# Patient Record
Sex: Female | Born: 1970 | Race: White | Hispanic: No | Marital: Single | Smoking: Former smoker
Health system: Southern US, Community
[De-identification: ages and names within clinical notes are randomized; demographics above are authoritative.]

## PROBLEM LIST (undated history)

## (undated) DIAGNOSIS — I2089 Other forms of angina pectoris: Secondary | ICD-10-CM

## (undated) DIAGNOSIS — R42 Dizziness and giddiness: Secondary | ICD-10-CM

## (undated) DIAGNOSIS — R000 Tachycardia, unspecified: Secondary | ICD-10-CM

## (undated) DIAGNOSIS — N951 Menopausal and female climacteric states: Secondary | ICD-10-CM

## (undated) DIAGNOSIS — F319 Bipolar disorder, unspecified: Secondary | ICD-10-CM

## (undated) DIAGNOSIS — E119 Type 2 diabetes mellitus without complications: Secondary | ICD-10-CM

## (undated) DIAGNOSIS — I10 Essential (primary) hypertension: Secondary | ICD-10-CM

## (undated) DIAGNOSIS — E785 Hyperlipidemia, unspecified: Secondary | ICD-10-CM

## (undated) DIAGNOSIS — I208 Other forms of angina pectoris: Secondary | ICD-10-CM

## (undated) DIAGNOSIS — R81 Glycosuria: Secondary | ICD-10-CM

## (undated) DIAGNOSIS — F4310 Post-traumatic stress disorder, unspecified: Secondary | ICD-10-CM

## (undated) DIAGNOSIS — M549 Dorsalgia, unspecified: Secondary | ICD-10-CM

## (undated) DIAGNOSIS — Z87898 Personal history of other specified conditions: Secondary | ICD-10-CM

## (undated) HISTORY — DX: Essential (primary) hypertension: I10

## (undated) HISTORY — DX: Glycosuria: R81

## (undated) HISTORY — PX: CARPAL TUNNEL RELEASE: SHX101

## (undated) HISTORY — DX: Bipolar disorder, unspecified: F31.9

## (undated) HISTORY — DX: Dizziness and giddiness: R42

## (undated) HISTORY — DX: Tachycardia, unspecified: R00.0

## (undated) HISTORY — PX: CHOLECYSTECTOMY: SHX55

## (undated) HISTORY — DX: Hyperlipidemia, unspecified: E78.5

## (undated) HISTORY — DX: Type 2 diabetes mellitus without complications: E11.9

## (undated) HISTORY — PX: ABDOMINAL HYSTERECTOMY: SHX81

## (undated) HISTORY — DX: Menopausal and female climacteric states: N95.1

## (undated) HISTORY — PX: ROTATOR CUFF REPAIR: SHX139

## (undated) HISTORY — DX: Personal history of other specified conditions: Z87.898

## (undated) HISTORY — DX: Other forms of angina pectoris: I20.8

## (undated) HISTORY — DX: Post-traumatic stress disorder, unspecified: F43.10

## (undated) HISTORY — PX: APPENDECTOMY: SHX54

## (undated) HISTORY — DX: Dorsalgia, unspecified: M54.9

---

## 2016-01-07 HISTORY — PX: CHOLECYSTECTOMY: SHX55

## 2019-03-28 ENCOUNTER — Encounter: Payer: Self-pay | Admitting: Internal Medicine

## 2019-04-21 ENCOUNTER — Other Ambulatory Visit: Payer: Self-pay

## 2019-04-21 ENCOUNTER — Telehealth: Payer: Self-pay | Admitting: Internal Medicine

## 2019-04-21 ENCOUNTER — Encounter: Payer: Self-pay | Admitting: Internal Medicine

## 2019-04-21 ENCOUNTER — Ambulatory Visit: Payer: Self-pay

## 2019-04-21 NOTE — Telephone Encounter (Signed)
Noted  

## 2019-04-21 NOTE — Telephone Encounter (Signed)
Patient was a no show and letter sent  °

## 2019-06-29 ENCOUNTER — Encounter: Payer: Self-pay | Admitting: *Deleted

## 2019-08-29 ENCOUNTER — Encounter: Payer: Self-pay | Admitting: Internal Medicine

## 2019-09-07 ENCOUNTER — Ambulatory Visit: Payer: Commercial Managed Care - PPO

## 2019-10-19 ENCOUNTER — Other Ambulatory Visit: Payer: Self-pay

## 2019-10-19 ENCOUNTER — Ambulatory Visit: Payer: Self-pay

## 2019-12-17 ENCOUNTER — Emergency Department (HOSPITAL_COMMUNITY)
Admission: EM | Admit: 2019-12-17 | Discharge: 2019-12-18 | Disposition: A | Discharge status: Home / Self Care | Payer: No Typology Code available for payment source | Attending: Emergency Medicine | Admitting: Emergency Medicine

## 2019-12-17 ENCOUNTER — Encounter (HOSPITAL_COMMUNITY): Payer: Self-pay | Admitting: Emergency Medicine

## 2019-12-17 ENCOUNTER — Other Ambulatory Visit: Payer: Self-pay

## 2019-12-17 DIAGNOSIS — E119 Type 2 diabetes mellitus without complications: Secondary | ICD-10-CM | POA: Insufficient documentation

## 2019-12-17 DIAGNOSIS — S32009A Unspecified fracture of unspecified lumbar vertebra, initial encounter for closed fracture: Secondary | ICD-10-CM | POA: Diagnosis not present

## 2019-12-17 DIAGNOSIS — M542 Cervicalgia: Secondary | ICD-10-CM | POA: Insufficient documentation

## 2019-12-17 DIAGNOSIS — E041 Nontoxic single thyroid nodule: Secondary | ICD-10-CM | POA: Insufficient documentation

## 2019-12-17 DIAGNOSIS — S3991XA Unspecified injury of abdomen, initial encounter: Secondary | ICD-10-CM | POA: Diagnosis not present

## 2019-12-17 DIAGNOSIS — Y92410 Unspecified street and highway as the place of occurrence of the external cause: Secondary | ICD-10-CM | POA: Insufficient documentation

## 2019-12-17 DIAGNOSIS — F17200 Nicotine dependence, unspecified, uncomplicated: Secondary | ICD-10-CM | POA: Diagnosis not present

## 2019-12-17 DIAGNOSIS — S0990XA Unspecified injury of head, initial encounter: Secondary | ICD-10-CM | POA: Diagnosis not present

## 2019-12-17 DIAGNOSIS — S34109A Unspecified injury to unspecified level of lumbar spinal cord, initial encounter: Secondary | ICD-10-CM | POA: Diagnosis present

## 2019-12-17 HISTORY — DX: Type 2 diabetes mellitus without complications: E11.9

## 2019-12-17 HISTORY — DX: Other forms of angina pectoris: I20.89
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
## 2019-12-18 ENCOUNTER — Emergency Department (HOSPITAL_COMMUNITY): Payer: No Typology Code available for payment source

## 2019-12-18 LAB — CBC WITH DIFFERENTIAL/PLATELET
Abs Immature Granulocytes: 0.04 10*3/uL (ref 0.00–0.07)
Basophils Absolute: 0.1 10*3/uL (ref 0.0–0.1)
Basophils Relative: 1 %
Eosinophils Absolute: 0.1 10*3/uL (ref 0.0–0.5)
Eosinophils Relative: 1 %
HCT: 38.1 % (ref 36.0–46.0)
Hemoglobin: 13 g/dL (ref 12.0–15.0)
Immature Granulocytes: 0 %
Lymphocytes Relative: 27 %
Lymphs Abs: 2.6 10*3/uL (ref 0.7–4.0)
MCH: 31.1 pg (ref 26.0–34.0)
MCHC: 34.1 g/dL (ref 30.0–36.0)
MCV: 91.1 fL (ref 80.0–100.0)
Monocytes Absolute: 0.8 10*3/uL (ref 0.1–1.0)
Monocytes Relative: 9 %
Neutro Abs: 5.9 10*3/uL (ref 1.7–7.7)
Neutrophils Relative %: 62 %
Platelets: 308 10*3/uL (ref 150–400)
RBC: 4.18 MIL/uL (ref 3.87–5.11)
RDW: 11.4 % — ABNORMAL LOW (ref 11.5–15.5)
WBC: 9.5 10*3/uL (ref 4.0–10.5)
nRBC: 0 % (ref 0.0–0.2)

## 2019-12-18 LAB — COMPREHENSIVE METABOLIC PANEL
ALT: 24 U/L (ref 0–44)
AST: 21 U/L (ref 15–41)
Albumin: 3.6 g/dL (ref 3.5–5.0)
Alkaline Phosphatase: 72 U/L (ref 38–126)
Anion gap: 12 (ref 5–15)
BUN: 8 mg/dL (ref 6–20)
CO2: 22 mmol/L (ref 22–32)
Calcium: 8.9 mg/dL (ref 8.9–10.3)
Chloride: 101 mmol/L (ref 98–111)
Creatinine, Ser: 0.68 mg/dL (ref 0.44–1.00)
GFR, Estimated: >60 mL/min (ref >60)
Glucose, Bld: 314 mg/dL — ABNORMAL HIGH (ref 70–99)
Potassium: 3.7 mmol/L (ref 3.5–5.1)
Sodium: 135 mmol/L (ref 135–145)
Total Bilirubin: 0.7 mg/dL (ref 0.3–1.2)
Total Protein: 6.8 g/dL (ref 6.5–8.1)

## 2019-12-18 LAB — I-STAT CHEM 8, ED
BUN: 10 mg/dL (ref 6–20)
Calcium, Ion: 1.17 mmol/L (ref 1.15–1.40)
Chloride: 102 mmol/L (ref 98–111)
Creatinine, Ser: 0.5 mg/dL (ref 0.44–1.00)
Glucose, Bld: 311 mg/dL — ABNORMAL HIGH (ref 70–99)
HCT: 39 % (ref 36.0–46.0)
Hemoglobin: 13.3 g/dL (ref 12.0–15.0)
Potassium: 3.7 mmol/L (ref 3.5–5.1)
Sodium: 137 mmol/L (ref 135–145)
TCO2: 25 mmol/L (ref 22–32)

## 2019-12-18 LAB — TROPONIN I (HIGH SENSITIVITY): Troponin I (High Sensitivity): <2 ng/L (ref <18)

## 2019-12-18 MED ORDER — ONDANSETRON HCL 4 MG/2ML IJ SOLN
4.0000 mg | Freq: Once | INTRAMUSCULAR | Status: AC
  Administered 2019-12-18: 09:00:00 4 mg via INTRAVENOUS
  Filled 2019-12-18: qty 2

## 2019-12-18 MED ORDER — DOCUSATE SODIUM 100 MG PO CAPS: 100.0000 mg | ORAL_CAPSULE | Freq: Two times a day (BID) | ORAL | 0 refills | Status: DC

## 2019-12-18 MED ORDER — OXYCODONE-ACETAMINOPHEN 5-325 MG PO TABS
1.0000 | ORAL_TABLET | Freq: Once | ORAL | Status: AC
  Administered 2019-12-18: 12:00:00 1 via ORAL
  Filled 2019-12-18: qty 1

## 2019-12-18 MED ORDER — FENTANYL CITRATE (PF) 100 MCG/2ML IJ SOLN
50.0000 ug | Freq: Once | INTRAMUSCULAR | Status: AC
  Administered 2019-12-18: 09:00:00 50 ug via INTRAVENOUS
  Filled 2019-12-18: qty 2

## 2019-12-18 MED ORDER — HYDROCODONE-ACETAMINOPHEN 5-325 MG PO TABS: 1.0000 | ORAL_TABLET | Freq: Four times a day (QID) | ORAL | 0 refills | Status: DC | PRN

## 2019-12-18 MED ORDER — NAPROXEN 375 MG PO TABS: 375.0000 mg | ORAL_TABLET | Freq: Two times a day (BID) | ORAL | 0 refills | Status: DC

## 2019-12-18 MED ORDER — IOHEXOL 300 MG/ML  SOLN
100.0000 mL | Freq: Once | INTRAMUSCULAR | Status: AC | PRN
  Administered 2019-12-18: 09:00:00 100 mL via INTRAVENOUS

## 2019-12-18 NOTE — ED Notes (Signed)
Ortho tech called for TLSO brace 

## 2019-12-18 NOTE — Discharge Instructions (Addendum)
You were given narcotic and or sedative medications while in the emergency department. Do not drive. Do not use machinery or power tools. Do not sign legal documents. Do not drink alcohol. Do not take sleeping pills. Do not supervise children by yourself. Do not participate in activities that require climbing or being in high places.   Return to the emergency department immediately if you develop any of the following symptoms: You have numbness, tingling, or weakness in the arms or legs. You develop severe headaches not relieved with medicine. You have severe neck pain, especially tenderness in the middle of the back of your neck. You have changes in bowel or bladder control. There is increasing pain in any area of the body. You have shortness of breath, light-headedness, dizziness, or fainting. You have chest pain. You feel sick to your stomach (nauseous), throw up (vomit), or sweat. You have increasing abdominal discomfort. There is blood in your urine, stool, or vomit. You have pain in your shoulder (shoulder strap areas). You feel your symptoms are getting worse.

## 2019-12-18 NOTE — Progress Notes (Signed)
Orthopedic Tech Progress Note Patient Details:  Michele Myers 12/17/1970 [id number]  Ortho Devices Type of Ortho Device: Lumbar corsett (TLSO) Ortho Device/Splint Interventions: Ordered,Application   Post Interventions Patient Tolerated: Well Instructions Provided: Adjustment of device,Care of device,Poper ambulation with device   Acey Woodfield P [name] 12/18/2019, 11:17 AM
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
## 2020-10-08 LAB — LIPID PANEL
Cholesterol: 302 — AB (ref 0–200)
HDL: 46 (ref 35–70)
LDL Cholesterol: 151
Triglycerides: 722 — AB (ref 40–160)

## 2020-10-08 LAB — TSH: TSH: 0.06 — AB (ref 0.41–5.90)

## 2020-10-08 LAB — BASIC METABOLIC PANEL
BUN: 13 (ref 4–21)
Creatinine: 0.8 (ref 0.5–1.1)

## 2020-10-08 LAB — MICROALBUMIN, URINE: Microalb, Ur: <0.2

## 2020-10-08 LAB — HEMOGLOBIN A1C: Hemoglobin A1C: 14

## 2020-11-07 ENCOUNTER — Other Ambulatory Visit (HOSPITAL_COMMUNITY): Payer: Self-pay | Admitting: Sports Medicine

## 2020-11-07 DIAGNOSIS — Z1231 Encounter for screening mammogram for malignant neoplasm of breast: Secondary | ICD-10-CM

## 2020-11-13 ENCOUNTER — Encounter: Payer: Self-pay | Admitting: Internal Medicine

## 2020-11-19 ENCOUNTER — Other Ambulatory Visit: Payer: Self-pay

## 2020-11-19 ENCOUNTER — Ambulatory Visit (HOSPITAL_COMMUNITY)
Admission: RE | Admit: 2020-11-19 | Discharge: 2020-11-19 | Disposition: A | Discharge status: Home / Self Care | Payer: 59 | Source: Ambulatory Visit | Attending: Sports Medicine | Admitting: Sports Medicine

## 2020-11-19 ENCOUNTER — Encounter (HOSPITAL_COMMUNITY): Payer: Self-pay

## 2020-11-19 DIAGNOSIS — Z1231 Encounter for screening mammogram for malignant neoplasm of breast: Secondary | ICD-10-CM | POA: Insufficient documentation

## 2020-11-28 ENCOUNTER — Encounter: Payer: Self-pay | Admitting: "Endocrinology

## 2020-11-28 ENCOUNTER — Ambulatory Visit (INDEPENDENT_AMBULATORY_CARE_PROVIDER_SITE_OTHER): Payer: 59 | Admitting: "Endocrinology

## 2020-11-28 ENCOUNTER — Other Ambulatory Visit: Payer: Self-pay

## 2020-11-28 ENCOUNTER — Other Ambulatory Visit: Payer: Self-pay | Admitting: "Endocrinology

## 2020-11-28 VITALS — BP 116/78 | HR 68 | Ht 59.0 in | Wt 127.0 lb

## 2020-11-28 DIAGNOSIS — E782 Mixed hyperlipidemia: Secondary | ICD-10-CM

## 2020-11-28 DIAGNOSIS — E1165 Type 2 diabetes mellitus with hyperglycemia: Secondary | ICD-10-CM | POA: Insufficient documentation

## 2020-11-28 DIAGNOSIS — I10 Essential (primary) hypertension: Secondary | ICD-10-CM | POA: Insufficient documentation

## 2020-11-28 MED ORDER — ACCU-CHEK GUIDE ME W/DEVICE KIT: 1.0000 | PACK | 0 refills | Status: AC

## 2020-11-28 MED ORDER — ACCU-CHEK GUIDE VI STRP: ORAL_STRIP | 2 refills | Status: DC

## 2020-11-28 NOTE — Patient Instructions (Signed)

## 2020-11-28 NOTE — Progress Notes (Signed)
Endocrinology Consult Note       11/28/2020, 9:06 PM   Subjective:    Patient ID: Michele Myers, female    DOB: [birth year].  Michele Myers is being seen in consultation for management of currently uncontrolled symptomatic diabetes requested by  Leonie Douglas, MD.   Past Medical History:  Diagnosis Date   Angina at rest [facility])    Backache    Bipolar 1 disorder, depressed ([place])    Diabetes mellitus without complication ([place])    Diabetes mellitus, type II ([place])    Dizzy spells    Glucosuria    History of seizures    Hyperlipidemia    Hypertension    Menopausal hot flushes    PTSD (post-traumatic stress disorder)    Tachycardia     Past Surgical History:  Procedure Laterality Date   ABDOMINAL HYSTERECTOMY     APPENDECTOMY     CARPAL TUNNEL RELEASE     CESAREAN SECTION     CHOLECYSTECTOMY     ROTATOR CUFF REPAIR      Social History   Socioeconomic History   Marital status: Single    Spouse name: Not on file   Number of children: Not on file   Years of education: Not on file   Highest education level: Not on file  Occupational History   Not on file  Tobacco Use   Smoking status: Former    Types: Cigarettes    Quit date: 09/10/2020    Years since quitting: 0.2   Smokeless tobacco: Never  Vaping Use   Vaping Use: Every day  Substance and Sexual Activity   Alcohol use: Never   Drug use: Never   Sexual activity: Not on file  Other Topics Concern   Not on file  Social History Narrative   Not on file   Social Determinants of Health   Financial Resource Strain: Not on file  Food Insecurity: Not on file  Transportation Needs: Not on file  Physical Activity: Not on file  Stress: Not on file  Social Connections: Not on file    Family History  Problem Relation Age of Onset   Cancer Mother    Kidney disease Mother    Thyroid disease Mother    Diabetes Mother     Hyperlipidemia Mother    Osteoporosis Mother    Cancer Father    Hyperlipidemia Father    Hypertension Father    Heart attack Father    Breast cancer Sister    Breast cancer Maternal Aunt     Outpatient Encounter Medications as of 11/28/2020  Medication Sig   Blood Glucose Monitoring Suppl (ACCU-CHEK GUIDE ME) w/Device KIT 1 Piece by Does not apply route as directed.   nitroGLYCERIN (NITROSTAT) 0.4 MG SL tablet Place 0.4 mg under the tongue every 5 (five) minutes as needed for chest pain.   [DISCONTINUED] glucose blood (ACCU-CHEK GUIDE) test strip Use as instructed   atorvastatin (LIPITOR) 20 MG tablet Take 20 mg by mouth daily.   buPROPion (WELLBUTRIN SR) 150 MG 12 hr tablet Take 150 mg by mouth every morning.   FLUoxetine (PROZAC) 20 MG  capsule Take 20 mg by mouth daily.   lisinopril (ZESTRIL) 5 MG tablet Take 5 mg by mouth daily.   metFORMIN (GLUCOPHAGE) 500 MG tablet Take 500 mg by mouth 2 (two) times daily after a meal.   [DISCONTINUED] docusate sodium (COLACE) 100 MG capsule Take 1 capsule (100 mg total) by mouth every 12 (twelve) hours.   [DISCONTINUED] HYDROcodone-acetaminophen (NORCO) 5-325 MG tablet Take 1 tablet by mouth every 6 (six) hours as needed for severe pain.   [DISCONTINUED] naproxen (NAPROSYN) 375 MG tablet Take 1 tablet (375 mg total) by mouth 2 (two) times daily with a meal.   No facility-administered encounter medications on file as of 11/28/2020.    ALLERGIES: Allergies  Allergen Reactions   Codeine Anxiety    Rapid heart beat    VACCINATION STATUS:  There is no immunization history on file for this patient.  Diabetes She has type 2 diabetes mellitus. Onset time: she was diagnosed at approximate age of 50 yrs. There are no hypoglycemic associated symptoms. Pertinent negatives for hypoglycemia include no headaches, seizures or tremors. Associated symptoms include polydipsia and polyuria. Pertinent negatives for diabetes include no chest pain. There are  no hypoglycemic complications. Symptoms are worsening. There are no diabetic complications. Risk factors for coronary artery disease include dyslipidemia, diabetes mellitus, family history, sedentary lifestyle and tobacco exposure. Current diabetic treatment includes oral agent (monotherapy). Her weight is fluctuating minimally. She is following a generally unhealthy diet. When asked about meal planning, she reported none. She has not had a previous visit with a dietitian. She never participates in exercise. Her home blood glucose trend is fluctuating minimally. (She did not bring any meter nor logs to review. Her a1c was found to be  14%.  She does not have a functioning meter.) An ACE inhibitor/angiotensin II receptor blocker is being taken. Eye exam is current.  Hyperlipidemia This is a chronic problem. The current episode started more than 1 year ago. The problem is uncontrolled. Pertinent negatives include no chest pain, myalgias or shortness of breath. Current antihyperlipidemic treatment includes statins. Risk factors for coronary artery disease include diabetes mellitus, dyslipidemia, family history, hypertension and a sedentary lifestyle.  Hypertension This is a chronic problem. The current episode started more than 1 year ago. Pertinent negatives include no chest pain, headaches, palpitations or shortness of breath. Risk factors for coronary artery disease include diabetes mellitus, dyslipidemia, sedentary lifestyle and smoking/tobacco exposure. Past treatments include ACE inhibitors.    Review of Systems  Constitutional:  Negative for chills and fever.  Respiratory:  Negative for cough and shortness of breath.   Cardiovascular:  Negative for chest pain and palpitations.       No Shortness of breath  Gastrointestinal:  Negative for abdominal pain, diarrhea, nausea and vomiting.  Endocrine: Positive for polydipsia and polyuria.  Genitourinary:  Negative for frequency, hematuria and urgency.   Musculoskeletal:  Negative for myalgias.  Skin:  Negative for rash.  Neurological:  Negative for tremors, seizures and headaches.  Hematological:  Does not bruise/bleed easily.  Psychiatric/Behavioral:  Negative for hallucinations and suicidal ideas.    Objective:    Vitals with BMI 11/28/2020 12/18/2019 12/18/2019  Height _0  - -  Weight 127 lbs - -  BMI 83.41 - -  Systolic 962 229 798  Diastolic 78 71 62  Pulse 68 68 63    BP 116/78   Pulse 68   Ht _1  (1.499 m)   Wt 127 lb (57.6 kg)   BMI  25.65 kg/m   Wt Readings from Last 3 Encounters:  11/28/20 127 lb (57.6 kg)  12/17/19 143 lb (64.9 kg)     Physical Exam Constitutional:      Appearance: She is well-developed.  HENT:     Head: Normocephalic and atraumatic.  Neck:     Thyroid: No thyromegaly.     Trachea: No tracheal deviation.  Cardiovascular:     Rate and Rhythm: Normal rate and regular rhythm.  Pulmonary:     Effort: Pulmonary effort is normal.     Breath sounds: Normal breath sounds.  Abdominal:     Tenderness: There is no abdominal tenderness. There is no guarding.  Musculoskeletal:        General: Normal range of motion.     Cervical back: Normal range of motion and neck supple.  Skin:    General: Skin is warm and dry.     Coloration: Skin is not pale.     Findings: No erythema or rash.  Neurological:     Mental Status: She is alert and oriented to person, place, and time.     Cranial Nerves: No cranial nerve deficit.     Coordination: Coordination normal.     Deep Tendon Reflexes: Reflexes are normal and symmetric.  Psychiatric:        Judgment: Judgment normal.      CMP ( most recent) CMP     Component Value Date/Time   NA 137 12/18/2019 0858   K 3.7 12/18/2019 0858   CL 102 12/18/2019 0858   CO2 22 12/18/2019 0849   GLUCOSE 311 (H) 12/18/2019 0858   BUN 13 10/08/2020 0000   CREATININE 0.8 10/08/2020 0000   CREATININE 0.50 12/18/2019 0858   CALCIUM 8.9 12/18/2019 0849    PROT 6.8 12/18/2019 0849   ALBUMIN 3.6 12/18/2019 0849   AST 21 12/18/2019 0849   ALT 24 12/18/2019 0849   ALKPHOS 72 12/18/2019 0849   BILITOT 0.7 12/18/2019 0849   GFRNONAA >60 12/18/2019 0849     Diabetic Labs (most recent): Lab Results  Component Value Date   HGBA1C 14 10/08/2020     Lipid Panel ( most recent) Lipid Panel     Component Value Date/Time   CHOL 302 (A) 10/08/2020 0000   TRIG 722 (A) 10/08/2020 0000   HDL 46 10/08/2020 0000   LDLCALC 151 10/08/2020 0000      Lab Results  Component Value Date   TSH 0.06 (A) 10/08/2020       Assessment & Plan:   1. Type 2 diabetes mellitus with hyperglycemia, without long-term current use of insulin ([place])   - Aleshia Cartelli Goyal has currently uncontrolled symptomatic type 2 DM since  50 years of age,  with most recent A1c of 14%. Recent labs reviewed. - I had a long discussion with her about the progressive nature of diabetes and the pathology behind its complications. 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 These are all discussed in detail with her.  - I have counseled her on diet  and weight management  by adopting a carbohydrate restricted/protein rich diet. Patient is encouraged to switch to  unprocessed or minimally processed     complex starch and increased protein intake (animal or plant source), fruits, and vegetables. -  she is advised to stick to a routine mealtimes to  eat 3 meals  a day and avoid unnecessary snacks ( to snack only to correct hypoglycemia).   - she acknowledges that there is a room for improvement in her food and drink choices. - Suggestion is made for her to avoid simple carbohydrates  from her diet including Cakes, Sweet Desserts, Ice Cream, Soda (diet and regular), Sweet Tea, Candies, Chips, Cookies, Store Bought Juices,  Alcohol in Excess of  1-2 drinks a day, Artificial Sweeteners,  Coffee Creamer, and "Sugar-free" Products. This will help patient to have more stable blood glucose profile and potentially avoid unintended weight gain.  - she will be scheduled with Jearld Fenton, RDN, CDE for diabetes education.  - I have approached her with the following individualized plan to manage  her diabetes and patient agrees:   - she weighed 100 + lbs more than her current weight in th epast, lost weigh mostly unintentionally. 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 Priority will be to avoid hypoglycemia in this patient. She will likely need at least basal insulin in order for her to control glycemia. In prep, she is approached to initiate strict monitoring of glucose 4 times a day  -before meals and at bedtime, and return for  avisit in  days with her meter and logs.  -currently, she is not symptomatic of hyperglycemia.  - she is encouraged to call clinic for blood glucose levels less than 70 or above 300 mg /dl. - she is advised to increase metformin to 522m po BID, she hesitantly accepts.   - she is not a candidate for SGLT2 inhibitors and incretin therapy .  - Specific targets for  A1c;  LDL, HDL,  and Triglycerides were discussed with the patient.  2) Blood Pressure /Hypertension:  her blood pressure is controlled to target.   she is advised to continue her current medications including lisinopril 5 mg p.o. daily with breakfast . 3) Lipids/Hyperlipidemia:   Review of her recent lipid panel showed uncontrolled  LDL at 151.  she  is advised to continue   atorvastatin 20 mg daily at bedtime.  Side effects and precautions discussed with her.  4)  Weight/Diet:  Body mass index is 25.65 kg/m.  -    she is not a candidate for weight loss. I discussed with her the fact that loss of 5 - 10% of her  current body weight will have the most impact on her diabetes management.   Plant  [employer] , [employer]- Lifestyle Nutrition is discussed and recommended to the patient. Optimal Exercise, Restorative Sleep  information was detailed on discharge instructions. If she continues to lose weight unintentionally, she will be considered for creon therapy.  5) Chronic Care/Health Maintenance:  -she  is on ACEI/ARB and Statin medications and  is encouraged to initiate and continue to follow up with Ophthalmology, Dentist,  Podiatrist at least yearly or according to recommendations, and advised to  quit smoking. I have recommended yearly flu vaccine and pneumonia vaccine at least every 5 years; moderate intensity exercise for up to 150 minutes weekly; and  sleep for 7- 9 hours a day.  The patient was counseled on the dangers of tobacco use, and was advised to quit.  Reviewed strategies to maximize success, including removing cigarettes and smoking materials from environment.   - she is  advised to maintain close follow up with BLeonie Douglas MD for primary care needs, as well as her other providers for optimal and coordinated  care.   I spent 65 minutes in the care of the patient today including review of labs from [place], Lipids, Thyroid Function, Hematology (current and previous including abstractions from other facilities); face-to-face time discussing  her blood glucose readings/logs, discussing hypoglycemia and hyperglycemia episodes and symptoms, medications doses, her options of short and long term treatment based on the latest standards of care / guidelines;  discussion about incorporating lifestyle medicine;  and documenting the encounter.     Please refer to Patient Instructions for Blood Glucose Monitoring and Insulin/Medications Dosing Guide"  in media tab for additional information. Please  also refer to " Patient Self Inventory" in the Media  tab for reviewed elements of pertinent patient history.  Michele Manges Reposa participated in the discussions, expressed understanding,  and voiced agreement with the above plans.  All questions were answered to her satisfaction. she is encouraged to contact clinic should she have any questions or concerns prior to her return visit.   Follow up plan: - Return in about 1 week (around 12/05/2020).  Glade Lloyd, MD [facility] [address] [place], [place] [address] Phone: [phone]  Fax: [phone]    11/28/2020, 9:06 PM  This note was partially dictated with voice recognition software. Similar sounding words can be transcribed inadequately or may not  be corrected upon review.

## 2020-11-28 NOTE — Telephone Encounter (Signed)
Patient had first visit today with you.  How many times does patient need to check BG daily? Insurance needs for pharmacy to fill for patient.

## 2020-12-10 ENCOUNTER — Ambulatory Visit (INDEPENDENT_AMBULATORY_CARE_PROVIDER_SITE_OTHER): Payer: BC Managed Care – PPO | Admitting: "Endocrinology

## 2020-12-10 ENCOUNTER — Encounter: Payer: Self-pay | Admitting: "Endocrinology

## 2020-12-10 ENCOUNTER — Other Ambulatory Visit: Payer: Self-pay

## 2020-12-10 VITALS — BP 108/76 | HR 72 | Ht 59.0 in | Wt 132.4 lb

## 2020-12-10 DIAGNOSIS — I10 Essential (primary) hypertension: Secondary | ICD-10-CM | POA: Diagnosis not present

## 2020-12-10 DIAGNOSIS — E782 Mixed hyperlipidemia: Secondary | ICD-10-CM | POA: Diagnosis not present

## 2020-12-10 DIAGNOSIS — E1165 Type 2 diabetes mellitus with hyperglycemia: Secondary | ICD-10-CM | POA: Diagnosis not present

## 2020-12-10 MED ORDER — METFORMIN HCL ER 500 MG PO TB24: 500.0000 mg | ORAL_TABLET | Freq: Every day | ORAL | 3 refills | Status: DC

## 2020-12-10 MED ORDER — GLIPIZIDE ER 2.5 MG PO TB24: 5.0000 mg | ORAL_TABLET | Freq: Every day | ORAL | 1 refills | Status: DC

## 2020-12-10 NOTE — Progress Notes (Signed)
12/10/2020, 5:56 PM  Endocrinology follow-up note   Subjective:    Patient ID: Michele Myers, female    DOB: 10/15/1970.  Michele Myers is being seen in follow-up after she was seen in consultation for management of currently uncontrolled symptomatic diabetes requested by  Leonie Douglas, MD.   Past Medical History:  Diagnosis Date   Angina at rest [facility])    Backache    Bipolar 1 disorder, depressed ([place])    Diabetes mellitus without complication ([place])    Diabetes mellitus, type II ([place])    Dizzy spells    Glucosuria    History of seizures    Hyperlipidemia    Hypertension    Menopausal hot flushes    PTSD (post-traumatic stress disorder)    Tachycardia     Past Surgical History:  Procedure Laterality Date   ABDOMINAL HYSTERECTOMY     APPENDECTOMY     CARPAL TUNNEL RELEASE     CESAREAN SECTION     CHOLECYSTECTOMY     ROTATOR CUFF REPAIR      Social History   Socioeconomic History   Marital status: Single    Spouse name: Not on file   Number of children: Not on file   Years of education: Not on file   Highest education level: Not on file  Occupational History   Not on file  Tobacco Use   Smoking status: Former    Types: Cigarettes    Quit date: 09/10/2020    Years since quitting: 0.2   Smokeless tobacco: Never  Vaping Use   Vaping Use: Every day  Substance and Sexual Activity   Alcohol use: Never   Drug use: Never   Sexual activity: Not on file  Other Topics Concern   Not on file  Social History Narrative   Not on file   Social Determinants of Health   Financial Resource Strain: Not on file  Food Insecurity: Not on file  Transportation Needs: Not on file  Physical Activity: Not on file  Stress: Not on file  Social Connections: Not on file    Family History  Problem Relation Age of Onset   Cancer Mother    Kidney disease Mother    Thyroid disease Mother     Diabetes Mother    Hyperlipidemia Mother    Osteoporosis Mother    Cancer Father    Hyperlipidemia Father    Hypertension Father    Heart attack Father    Breast cancer Sister    Breast cancer Maternal Aunt     Outpatient Encounter Medications as of 12/10/2020  Medication Sig   glipiZIDE (GLUCOTROL XL) 2.5 MG 24 hr tablet Take 2 tablets (5 mg total) by mouth daily with breakfast.   metFORMIN (GLUCOPHAGE XR) 500 MG 24 hr tablet Take 1 tablet (500 mg total) by mouth daily with breakfast.   atorvastatin (LIPITOR) 20 MG tablet Take 20 mg by mouth daily.   Blood Glucose Monitoring Suppl (ACCU-CHEK GUIDE ME) w/Device KIT 1 Piece by Does not apply route as directed.   buPROPion (WELLBUTRIN SR) 150 MG 12 hr tablet Take 150 mg by mouth every morning.  FLUoxetine (PROZAC) 20 MG capsule Take 20 mg by mouth daily.   glucose blood (ACCU-CHEK GUIDE) test strip USE TO CHECK BOOD GLUCOSE  4 TIMES DAILY   lisinopril (ZESTRIL) 5 MG tablet Take 5 mg by mouth daily.   nitroGLYCERIN (NITROSTAT) 0.4 MG SL tablet Place 0.4 mg under the tongue every 5 (five) minutes as needed for chest pain.   [DISCONTINUED] metFORMIN (GLUCOPHAGE) 500 MG tablet Take 500 mg by mouth 2 (two) times daily after a meal.   No facility-administered encounter medications on file as of 12/10/2020.    ALLERGIES: Allergies  Allergen Reactions   Codeine Anxiety    Rapid heart beat    VACCINATION STATUS:  There is no immunization history on file for this patient.  Diabetes She presents for her follow-up diabetic visit. She has type 2 diabetes mellitus. Onset time: she was diagnosed at approximate age of 9 yrs. There are no hypoglycemic associated symptoms. Pertinent negatives for hypoglycemia include no headaches, seizures or tremors. Associated symptoms include blurred vision. Pertinent negatives for diabetes include no chest pain, no polydipsia and no polyuria. There are no hypoglycemic complications. Symptoms are worsening.  There are no diabetic complications. Risk factors for coronary artery disease include dyslipidemia, diabetes mellitus, family history, sedentary lifestyle and tobacco exposure. Current diabetic treatment includes oral agent (monotherapy). Her weight is fluctuating minimally. She is following a generally unhealthy diet. When asked about meal planning, she reported none. She has not had a previous visit with a dietitian. She never participates in exercise. Her home blood glucose trend is decreasing steadily. Her breakfast blood glucose range is generally >200 mg/dl. Her lunch blood glucose range is generally 140-180 mg/dl. Her dinner blood glucose range is generally 140-180 mg/dl. Her bedtime blood glucose range is generally 140-180 mg/dl. Her overall blood glucose range is 180-200 mg/dl. (She made significant change in her diet.  Presents with much better blood glucose profile.  This is despite her recent A1c of 14%.  She still has uncontrolled fasting glycemic profile.   ) An ACE inhibitor/angiotensin II receptor blocker is being taken. Eye exam is current.  Hyperlipidemia This is a chronic problem. The current episode started more than 1 year ago. The problem is uncontrolled. Pertinent negatives include no chest pain, myalgias or shortness of breath. Current antihyperlipidemic treatment includes statins. Risk factors for coronary artery disease include diabetes mellitus, dyslipidemia, family history, hypertension and a sedentary lifestyle.  Hypertension This is a chronic problem. The current episode started more than 1 year ago. Associated symptoms include blurred vision. Pertinent negatives include no chest pain, headaches, palpitations or shortness of breath. Risk factors for coronary artery disease include diabetes mellitus, dyslipidemia, sedentary lifestyle and smoking/tobacco exposure. Past treatments include ACE inhibitors.    Review of Systems  Constitutional:  Negative for chills and fever.  Eyes:   Positive for blurred vision.  Respiratory:  Negative for cough and shortness of breath.   Cardiovascular:  Negative for chest pain and palpitations.       No Shortness of breath  Gastrointestinal:  Negative for abdominal pain, diarrhea, nausea and vomiting.  Endocrine: Negative for polydipsia and polyuria.  Genitourinary:  Negative for frequency, hematuria and urgency.  Musculoskeletal:  Negative for myalgias.  Skin:  Negative for rash.  Neurological:  Negative for tremors, seizures and headaches.  Hematological:  Does not bruise/bleed easily.  Psychiatric/Behavioral:  Negative for hallucinations and suicidal ideas.    Objective:    Vitals with BMI 12/10/2020 11/28/2020 12/18/2019  Height 4'  11" 4' 11"  -  Weight 132 lbs 6 oz 127 lbs -  BMI 58.09 98.33 -  Systolic 825 053 976  Diastolic 76 78 71  Pulse 72 68 68    BP 108/76   Pulse 72   Ht 4' 11"  (1.499 m)   Wt 132 lb 6.4 oz (60.1 kg)   BMI 26.74 kg/m   Wt Readings from Last 3 Encounters:  12/10/20 132 lb 6.4 oz (60.1 kg)  11/28/20 127 lb (57.6 kg)  12/17/19 143 lb (64.9 kg)     Physical Exam Constitutional:      Appearance: She is well-developed.  HENT:     Head: Normocephalic and atraumatic.  Neck:     Thyroid: No thyromegaly.     Trachea: No tracheal deviation.  Cardiovascular:     Rate and Rhythm: Normal rate and regular rhythm.  Pulmonary:     Effort: Pulmonary effort is normal.     Breath sounds: Normal breath sounds.  Abdominal:     Tenderness: There is no abdominal tenderness. There is no guarding.  Musculoskeletal:        General: Normal range of motion.     Cervical back: Normal range of motion and neck supple.  Skin:    General: Skin is warm and dry.     Coloration: Skin is not pale.     Findings: No erythema or rash.  Neurological:     Mental Status: She is alert and oriented to person, place, and time.     Cranial Nerves: No cranial nerve deficit.     Coordination: Coordination normal.      Deep Tendon Reflexes: Reflexes are normal and symmetric.  Psychiatric:        Judgment: Judgment normal.      CMP ( most recent) CMP     Component Value Date/Time   NA 137 12/18/2019 0858   K 3.7 12/18/2019 0858   CL 102 12/18/2019 0858   CO2 22 12/18/2019 0849   GLUCOSE 311 (H) 12/18/2019 0858   BUN 13 10/08/2020 0000   CREATININE 0.8 10/08/2020 0000   CREATININE 0.50 12/18/2019 0858   CALCIUM 8.9 12/18/2019 0849   PROT 6.8 12/18/2019 0849   ALBUMIN 3.6 12/18/2019 0849   AST 21 12/18/2019 0849   ALT 24 12/18/2019 0849   ALKPHOS 72 12/18/2019 0849   BILITOT 0.7 12/18/2019 0849   GFRNONAA >60 12/18/2019 0849     Diabetic Labs (most recent): Lab Results  Component Value Date   HGBA1C 14 10/08/2020     Lipid Panel ( most recent) Lipid Panel     Component Value Date/Time   CHOL 302 (A) 10/08/2020 0000   TRIG 722 (A) 10/08/2020 0000   HDL 46 10/08/2020 0000   LDLCALC 151 10/08/2020 0000      Lab Results  Component Value Date   TSH 0.06 (A) 10/08/2020       Assessment & Plan:   1. Type 2 diabetes mellitus with hyperglycemia, without long-term current use of insulin ([place])   - Michele Myers has currently uncontrolled symptomatic type 2 DM since  50 years of age. She made significant change in her diet.  Presents with much better blood glucose profile.  This is despite her recent A1c of 14%.  She still has uncontrolled fasting glycemic profile.     Recent labs reviewed. - I had a long discussion with her about the progressive nature of diabetes and the pathology behind its complications. 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 These are all discussed in detail with her.  - I have counseled her on diet  and weight management  by adopting a carbohydrate restricted/protein rich diet.  Patient is encouraged to switch to  unprocessed or minimally processed     complex starch and increased protein intake (animal or plant source), fruits, and vegetables. -  she is advised to stick to a routine mealtimes to eat 3 meals  a day and avoid unnecessary snacks ( to snack only to correct hypoglycemia).   - she acknowledges that there is a room for improvement in her food and drink choices. - Suggestion is made for her to avoid simple carbohydrates  from her diet including Cakes, Sweet Desserts, Ice Cream, Soda (diet and regular), Sweet Tea, Candies, Chips, Cookies, Store Bought Juices, Alcohol in Excess of  1-2 drinks a day, Artificial Sweeteners,  Coffee Creamer, and "Sugar-free" Products, Lemonade. This will help patient to have more stable blood glucose profile and potentially avoid unintended weight gain.  - she will be scheduled with Jearld Fenton, RDN, CDE for diabetes education.  - I have approached her with the following individualized plan to manage  her diabetes and patient agrees:   - she weighed 100 + lbs more than her current weight in th epast, lost weigh mostly unintentionally. 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 Priority will be to avoid hypoglycemia in this patient.  She is determined to stay engaged in her lifestyle change.  She will not need insulin treatment for now.  However, to give her better control of fasting glycemic profile, I advised her to start glipizide 2.5 mg XL p.o. daily at breakfast.   -She did not tolerate regular strength metformin.  I discussed and prescribed metformin 500 mg XR p.o. daily at breakfast.  -She is willing and advised to continue monitoring blood glucose twice a day-daily before breakfast and at bedtime.   - she is encouraged to call clinic for blood glucose levels less than 70 or above 200 mg /dl.   - she is not a candidate for SGLT2 inhibitors and incretin therapy .  - Specific targets for  A1c;   LDL, HDL,  and Triglycerides were discussed with the patient.  2) Blood Pressure /Hypertension:  her blood pressure is controlled to target.   she is advised to continue her current medications including lisinopril 5 mg p.o. daily with breakfast . 3) Lipids/Hyperlipidemia:   Review of her recent lipid panel showed uncontrolled  LDL at 151.  she  is advised to continue   atorvastatin 20 mg p.o. daily at bedtime.   Side effects and precautions discussed with her.  4)  Weight/Diet:  Body mass index is 26.74 kg/m.  -    she is not a candidate for weight loss. I discussed with her the fact that loss of 5 - 10% of her  current body weight will have the most impact on her diabetes management.   [employer] , [employer]- Lifestyle Nutrition is discussed and recommended to the patient. Optimal Exercise, Restorative Sleep  information was detailed on discharge instructions. If she continues to lose weight unintentionally, she will be considered for creon therapy.  5) Chronic Care/Health Maintenance:  -she  is on ACEI/ARB and Statin medications and  is encouraged to  initiate and continue to follow up with Ophthalmology, Dentist,  Podiatrist at least yearly or according to recommendations, and advised to  quit smoking. I have recommended yearly flu vaccine and pneumonia vaccine at least every 5 years; moderate intensity exercise for up to 150 minutes weekly; and  sleep for 7- 9 hours a day.  The patient was counseled on the dangers of tobacco use, and was advised to quit.  Reviewed strategies to maximize success, including removing cigarettes and smoking materials from environment.   - she is  advised to maintain close follow up with Leonie Douglas, MD for primary care needs, as well as her other providers for optimal and coordinated care.   I spent 44 minutes in the care of the patient today including review of labs from [place], Lipids, Thyroid Function, Hematology (current and previous including  abstractions from other facilities); face-to-face time discussing  her blood glucose readings/logs, discussing hypoglycemia and hyperglycemia episodes and symptoms, medications doses, her options of short and long term treatment based on the latest standards of care / guidelines;  discussion about incorporating lifestyle medicine;  and documenting the encounter.    Please refer to Patient Instructions for Blood Glucose Monitoring and Insulin/Medications Dosing Guide"  in media tab for additional information. Please  also refer to " Patient Self Inventory" in the Media  tab for reviewed elements of pertinent patient history.  Michele Myers participated in the discussions, expressed understanding, and voiced agreement with the above plans.  All questions were answered to her satisfaction. she is encouraged to contact clinic should she have any questions or concerns prior to her return visit.    Follow up plan: - Return in about 3 months (around 03/10/2021) for Bring Meter and Logs- A1c in Office.  Glade Lloyd, MD [facility] [address] [place], [place] [address] Phone: [phone]  Fax: [phone]    12/10/2020, 5:56 PM  This note was partially dictated with voice recognition software. Similar sounding words can be transcribed inadequately or may not  be corrected upon review.

## 2020-12-10 NOTE — Patient Instructions (Signed)

## 2021-01-02 ENCOUNTER — Ambulatory Visit: Payer: Self-pay

## 2021-01-06 ENCOUNTER — Other Ambulatory Visit: Payer: Self-pay | Admitting: "Endocrinology

## 2021-01-16 ENCOUNTER — Ambulatory Visit: Payer: 59 | Admitting: Nutrition

## 2021-03-12 ENCOUNTER — Other Ambulatory Visit: Payer: Self-pay

## 2021-03-12 ENCOUNTER — Ambulatory Visit (INDEPENDENT_AMBULATORY_CARE_PROVIDER_SITE_OTHER): Payer: BC Managed Care – PPO | Admitting: "Endocrinology

## 2021-03-12 ENCOUNTER — Encounter: Payer: Self-pay | Admitting: "Endocrinology

## 2021-03-12 VITALS — BP 120/82 | HR 80 | Ht 59.0 in | Wt 144.6 lb

## 2021-03-12 DIAGNOSIS — E1165 Type 2 diabetes mellitus with hyperglycemia: Secondary | ICD-10-CM

## 2021-03-12 DIAGNOSIS — E782 Mixed hyperlipidemia: Secondary | ICD-10-CM | POA: Diagnosis not present

## 2021-03-12 DIAGNOSIS — I10 Essential (primary) hypertension: Secondary | ICD-10-CM

## 2021-03-12 LAB — POCT GLYCOSYLATED HEMOGLOBIN (HGB A1C): HbA1c, POC (controlled diabetic range): 8.5 % — AB (ref 0.0–7.0)

## 2021-03-12 MED ORDER — SITAGLIPTIN PHOSPHATE 50 MG PO TABS: 50.0000 mg | ORAL_TABLET | Freq: Every day | ORAL | 1 refills | Status: DC

## 2021-03-12 NOTE — Progress Notes (Signed)
03/12/2021, 4:53 PM  Endocrinology follow-up note   Subjective:    Patient ID: Michele Myers, female    DOB: 06-22-1970.  Michele Myers is being seen in follow-up after she was seen in consultation for management of currently uncontrolled symptomatic diabetes requested by  Leonie Douglas, MD.   Past Medical History:  Diagnosis Date   Angina at rest [facility])    Backache    Bipolar 1 disorder, depressed ([place])    Diabetes mellitus without complication ([place])    Diabetes mellitus, type II ([place])    Dizzy spells    Glucosuria    History of seizures    Hyperlipidemia    Hypertension    Menopausal hot flushes    PTSD (post-traumatic stress disorder)    Tachycardia     Past Surgical History:  Procedure Laterality Date   ABDOMINAL HYSTERECTOMY     APPENDECTOMY     CARPAL TUNNEL RELEASE     CESAREAN SECTION     CHOLECYSTECTOMY     ROTATOR CUFF REPAIR      Social History   Socioeconomic History   Marital status: Single    Spouse name: Not on file   Number of children: Not on file   Years of education: Not on file   Highest education level: Not on file  Occupational History   Not on file  Tobacco Use   Smoking status: Former    Types: Cigarettes    Quit date: 09/10/2020    Years since quitting: 0.5   Smokeless tobacco: Never  Vaping Use   Vaping Use: Every day  Substance and Sexual Activity   Alcohol use: Never   Drug use: Never   Sexual activity: Not on file  Other Topics Concern   Not on file  Social History Narrative   Not on file   Social Determinants of Health   Financial Resource Strain: Not on file  Food Insecurity: Not on file  Transportation Needs: Not on file  Physical Activity: Not on file  Stress: Not on file  Social Connections: Not on file    Family History  Problem Relation Age of Onset   Cancer Mother    Kidney disease Mother    Thyroid disease Mother     Diabetes Mother    Hyperlipidemia Mother    Osteoporosis Mother    Cancer Father    Hyperlipidemia Father    Hypertension Father    Heart attack Father    Breast cancer Sister    Breast cancer Maternal Aunt     Outpatient Encounter Medications as of 03/12/2021  Medication Sig   sitaGLIPtin (JANUVIA) 50 MG tablet Take 1 tablet (50 mg total) by mouth daily.   atorvastatin (LIPITOR) 20 MG tablet Take 20 mg by mouth daily.   Blood Glucose Monitoring Suppl (ACCU-CHEK GUIDE ME) w/Device KIT 1 Piece by Does not apply route as directed.   buPROPion (WELLBUTRIN SR) 150 MG 12 hr tablet Take 150 mg by mouth every morning.   FLUoxetine (PROZAC) 20 MG capsule Take 20 mg by mouth daily.   glipiZIDE (GLUCOTROL XL) 2.5 MG 24 hr tablet Take 1 tablet (2.5  mg total) by mouth daily with breakfast. (Patient taking differently: Take 5 mg by mouth daily with breakfast.)   glucose blood (ACCU-CHEK GUIDE) test strip USE TO CHECK BOOD GLUCOSE  4 TIMES DAILY   lisinopril (ZESTRIL) 5 MG tablet Take 5 mg by mouth daily.   nitroGLYCERIN (NITROSTAT) 0.4 MG SL tablet Place 0.4 mg under the tongue every 5 (five) minutes as needed for chest pain.   [DISCONTINUED] metFORMIN (GLUCOPHAGE-XR) 500 MG 24 hr tablet TAKE 1 TABLET BY MOUTH EVERY DAY WITH BREAKFAST   No facility-administered encounter medications on file as of 03/12/2021.    ALLERGIES: Allergies  Allergen Reactions   Codeine Anxiety    Rapid heart beat    VACCINATION STATUS:  There is no immunization history on file for this patient.  Diabetes She presents for her follow-up diabetic visit. She has type 2 diabetes mellitus. Onset time: she was diagnosed at approximate age of 27 yrs. Her disease course has been improving. There are no hypoglycemic associated symptoms. Pertinent negatives for hypoglycemia include no headaches, seizures or tremors. Pertinent negatives for diabetes include no blurred vision, no chest pain, no polydipsia and no polyuria. There are  no hypoglycemic complications. Symptoms are improving. There are no diabetic complications. Risk factors for coronary artery disease include dyslipidemia, diabetes mellitus, family history, sedentary lifestyle and tobacco exposure. Current diabetic treatment includes oral agent (monotherapy). Her weight is increasing steadily. She is following a generally unhealthy diet. When asked about meal planning, she reported none. She has not had a previous visit with a dietitian. She never participates in exercise. Her home blood glucose trend is decreasing steadily. Her breakfast blood glucose range is generally >200 mg/dl. Her overall blood glucose range is 180-200 mg/dl. (She presents with improved glycemic profile and her POC a1c is 8.5% , generally improving from 14%. She is not tolerating metfrormin.  ) An ACE inhibitor/angiotensin II receptor blocker is being taken. Eye exam is current.  Hyperlipidemia This is a chronic problem. The current episode started more than 1 year ago. The problem is uncontrolled. Pertinent negatives include no chest pain, myalgias or shortness of breath. Current antihyperlipidemic treatment includes statins. Risk factors for coronary artery disease include diabetes mellitus, dyslipidemia, family history, hypertension and a sedentary lifestyle.  Hypertension This is a chronic problem. The current episode started more than 1 year ago. Pertinent negatives include no blurred vision, chest pain, headaches, palpitations or shortness of breath. Risk factors for coronary artery disease include diabetes mellitus, dyslipidemia, sedentary lifestyle and smoking/tobacco exposure. Past treatments include ACE inhibitors.    Review of Systems  Constitutional:  Negative for chills and fever.  Eyes:  Negative for blurred vision.  Respiratory:  Negative for cough and shortness of breath.   Cardiovascular:  Negative for chest pain and palpitations.       No Shortness of breath  Gastrointestinal:   Negative for abdominal pain, diarrhea, nausea and vomiting.  Endocrine: Negative for polydipsia and polyuria.  Genitourinary:  Negative for frequency, hematuria and urgency.  Musculoskeletal:  Negative for myalgias.  Skin:  Negative for rash.  Neurological:  Negative for tremors, seizures and headaches.  Hematological:  Does not bruise/bleed easily.  Psychiatric/Behavioral:  Negative for hallucinations and suicidal ideas.    Objective:    Vitals with BMI 03/12/2021 12/10/2020 11/28/2020  Height _0  _1  _2   Weight 144 lbs 10 oz 132 lbs 6 oz 127 lbs  BMI 29.19 14.48 18.56  Systolic 314 970 263  Diastolic 82 76  78  Pulse 80 72 68    BP 120/82    Pulse 80    Ht _0  (1.499 m)    Wt 144 lb 9.6 oz (65.6 kg)    BMI 29.21 kg/m   Wt Readings from Last 3 Encounters:  03/12/21 144 lb 9.6 oz (65.6 kg)  12/10/20 132 lb 6.4 oz (60.1 kg)  11/28/20 127 lb (57.6 kg)         CMP ( most recent) CMP     Component Value Date/Time   NA 137 12/18/2019 0858   K 3.7 12/18/2019 0858   CL 102 12/18/2019 0858   CO2 22 12/18/2019 0849   GLUCOSE 311 (H) 12/18/2019 0858   BUN 13 10/08/2020 0000   CREATININE 0.8 10/08/2020 0000   CREATININE 0.50 12/18/2019 0858   CALCIUM 8.9 12/18/2019 0849   PROT 6.8 12/18/2019 0849   ALBUMIN 3.6 12/18/2019 0849   AST 21 12/18/2019 0849   ALT 24 12/18/2019 0849   ALKPHOS 72 12/18/2019 0849   BILITOT 0.7 12/18/2019 0849   GFRNONAA >60 12/18/2019 0849     Diabetic Labs (most recent): Lab Results  Component Value Date   HGBA1C 8.5 (A) 03/12/2021   HGBA1C 14 10/08/2020     Lipid Panel ( most recent) Lipid Panel     Component Value Date/Time   CHOL 302 (A) 10/08/2020 0000   TRIG 722 (A) 10/08/2020 0000   HDL 46 10/08/2020 0000   LDLCALC 151 10/08/2020 0000      Lab Results  Component Value Date   TSH 0.06 (A) 10/08/2020       Assessment & Plan:   1. Type 2 diabetes mellitus with hyperglycemia, without long-term current use of  insulin ([place])   - Michele Myers has currently uncontrolled symptomatic type 2 DM since  51 years of age. She made significant change in her diet.  She presents with improved glycemic profile and her POC a1c is 8.5% generally improving from 14%.  No hypoglycemia.    Recent labs reviewed. - I had a long discussion with her about the progressive nature of diabetes and the pathology behind its complications. 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 These are all discussed in detail with her.  - I have counseled her on diet  and weight management  by adopting a carbohydrate restricted/protein rich diet. Patient is encouraged to switch to  unprocessed or minimally processed     complex starch and increased protein intake (animal or plant source), fruits, and vegetables. -  she is advised to stick to a routine mealtimes to eat 3 meals  a day and avoid unnecessary snacks ( to snack only to correct hypoglycemia).   - she acknowledges that there is a room for improvement in her food and drink choices. - Suggestion is made for her to avoid simple carbohydrates  from her diet including Cakes, Sweet Desserts, Ice Cream, Soda (diet and regular), Sweet Tea, Candies, Chips, Cookies, Store Bought Juices, Alcohol in Excess of  1-2 drinks a day, Artificial Sweeteners,  Coffee Creamer, and "Sugar-free" Products, Lemonade. This will help patient to have more stable blood glucose profile and potentially avoid unintended weight gain.  - she will be scheduled with Jearld Fenton, RDN, CDE for diabetes education.  - I have approached her with the following individualized plan to  manage  her diabetes and patient agrees:   -  historically , she weighed 100 + lbs more than her current weight in th epast, lost weight mostly unintentionally.  Currently  regaining. 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 Priority will be to avoid hypoglycemia in this patient.  She is determined to stay engaged in her lifestyle change.  She will not need insulin treatment for now.  However, to give her better control of fasting glycemic profile, I advised her to  continue  glipizide 5 mg XL p.o. daily at breakfast.   -She did not tolerate even the extended release metformin.  I discussed and  d/ced  metformin for her. I added Januvia 54m po qam with breakfast. -She is willing and advised to continue monitoring blood glucose twice a day-daily before breakfast and at bedtime.   - she is encouraged to call clinic for blood glucose levels less than 70 or above 200 mg /dl.    - Specific targets for  A1c;  LDL, HDL,  and Triglycerides were discussed with the patient.  2) Blood Pressure /Hypertension:  her blood pressure is controlled to target.   she is advised to continue her current medications including lisinopril 5 mg p.o. daily with breakfast . 3) Lipids/Hyperlipidemia:   Review of her recent lipid panel showed uncontrolled  LDL at 151.  she  is advised to continue  Atorvastatin 20 mg p.o. daily at bedtime.   Side effects and precautions discussed with her.  4)  Weight/Diet:  Body mass index is 29.21 kg/m.  -    she is not a candidate for weight loss. I discussed with her the fact that loss of 5 - 10% of her  current body weight will have the most impact on her diabetes management.   Pl[employer] , [employer]- Lifestyle Nutrition is discussed and recommended to the patient. Optimal Exercise, Restorative Sleep  information was detailed on discharge instructions. If she continues to lose weight unintentionally, she will be considered for creon therapy.  5) Chronic Care/Health Maintenance:  -she  is on ACEI/ARB and Statin medications and  is encouraged to initiate and continue to follow up with Ophthalmology, Dentist,   Podiatrist at least yearly or according to recommendations, and advised to  quit smoking. I have recommended yearly flu vaccine and pneumonia vaccine at least every 5 years; moderate intensity exercise for up to 150 minutes weekly; and  sleep for 7- 9 hours a day.  The patient was counseled on the dangers of tobacco use, and was advised to quit.  Reviewed strategies to maximize success, including removing cigarettes and smoking materials from environment.    - she is  advised to maintain close follow up with BLeonie Douglas MD for primary care needs, as well as her other providers for optimal and coordinated care.    I spent 34 minutes in the care of the patient today including review of labs from [facility], Thyroid Function, Hematology (current and previous including abstractions from other facilities); face-to-face time discussing  her blood glucose readings/logs, discussing hypoglycemia and hyperglycemia episodes and symptoms, medications doses, her options of short and long term treatment based on the latest standards of care / guidelines;  discussion about incorporating lifestyle medicine;  and documenting the encounter.    Please refer to Patient Instructions for Blood Glucose Monitoring and Insulin/Medications Dosing Guide"  in media tab for additional information. Please  also refer to " Patient Self  Inventory" in the Media  tab for reviewed elements of pertinent patient history.  Michele Myers participated in the discussions, expressed understanding, and voiced agreement with the above plans.  All questions were answered to her satisfaction. she is encouraged to contact clinic should she have any questions or concerns prior to her return visit.   Follow up plan: - Return in about 3 months (around 06/12/2021) for F/U with Pre-visit Labs, Meter, Logs, A1c here.Glade Lloyd, MD [facility] [address] [place], [place]  [address] Phone: [phone]  Fax: [phone]    03/12/2021, 4:53 PM  This note was partially dictated with voice recognition software. Similar sounding words can be transcribed inadequately or may not  be corrected upon review.

## 2021-03-12 NOTE — Patient Instructions (Signed)

## 2021-05-18 ENCOUNTER — Other Ambulatory Visit: Payer: Self-pay | Admitting: "Endocrinology

## 2021-05-29 ENCOUNTER — Other Ambulatory Visit: Payer: Self-pay | Admitting: "Endocrinology

## 2021-06-12 ENCOUNTER — Ambulatory Visit: Payer: Self-pay | Admitting: "Endocrinology

## 2021-07-01 LAB — BASIC METABOLIC PANEL
BUN: 25 — AB (ref 4–21)
CO2: 23 — AB (ref 13–22)
Chloride: 93 — AB (ref 99–108)
Creatinine: 1.1 (ref 0.5–1.1)
Glucose: 628
Potassium: 5.4 mEq/L — AB (ref 3.5–5.1)
Sodium: 129 — AB (ref 137–147)

## 2021-07-01 LAB — COMPREHENSIVE METABOLIC PANEL
Albumin: 4.2 (ref 3.5–5.0)
Calcium: 10.2 (ref 8.7–10.7)
Globulin: 3.2

## 2021-07-01 LAB — LIPID PANEL
Cholesterol: 320 — AB (ref 0–200)
HDL: 50 (ref 35–70)
LDL Cholesterol: 189
Triglycerides: 532 — AB (ref 40–160)

## 2021-07-01 LAB — TSH: TSH: 0.2 — AB (ref 0.41–5.90)

## 2021-07-01 LAB — HEMOGLOBIN A1C: Hemoglobin A1C: >14

## 2021-07-01 LAB — HEPATIC FUNCTION PANEL
ALT: 11 U/L (ref 7–35)
Alkaline Phosphatase: 98 (ref 25–125)
Bilirubin, Total: 0.5

## 2021-07-04 ENCOUNTER — Encounter: Payer: Self-pay | Admitting: *Deleted

## 2021-08-28 ENCOUNTER — Other Ambulatory Visit: Payer: Self-pay | Admitting: "Endocrinology

## 2021-09-12 DIAGNOSIS — S32009A Unspecified fracture of unspecified lumbar vertebra, initial encounter for closed fracture: Secondary | ICD-10-CM | POA: Insufficient documentation

## 2021-09-13 DIAGNOSIS — M461 Sacroiliitis, not elsewhere classified: Secondary | ICD-10-CM | POA: Insufficient documentation

## 2021-09-23 ENCOUNTER — Telehealth: Payer: Self-pay | Admitting: "Endocrinology

## 2021-09-23 NOTE — Telephone Encounter (Signed)
Received updated labs on pt from PCP. I called and left her a vm to set up a follow up with Dr Dorris Fetch. Caryl Asp has her labs

## 2021-09-24 ENCOUNTER — Ambulatory Visit (INDEPENDENT_AMBULATORY_CARE_PROVIDER_SITE_OTHER): Payer: BC Managed Care – PPO | Admitting: "Endocrinology

## 2021-09-24 ENCOUNTER — Encounter: Payer: Self-pay | Admitting: "Endocrinology

## 2021-09-24 ENCOUNTER — Other Ambulatory Visit: Payer: Self-pay | Admitting: "Endocrinology

## 2021-09-24 VITALS — BP 110/68 | HR 64 | Ht 59.0 in | Wt 127.4 lb

## 2021-09-24 DIAGNOSIS — E1165 Type 2 diabetes mellitus with hyperglycemia: Secondary | ICD-10-CM | POA: Diagnosis not present

## 2021-09-24 DIAGNOSIS — I10 Essential (primary) hypertension: Secondary | ICD-10-CM | POA: Diagnosis not present

## 2021-09-24 DIAGNOSIS — E782 Mixed hyperlipidemia: Secondary | ICD-10-CM | POA: Diagnosis not present

## 2021-09-24 LAB — POCT GLYCOSYLATED HEMOGLOBIN (HGB A1C): HbA1c, POC (controlled diabetic range): 14.5 % — AB (ref 0.0–7.0)

## 2021-09-24 MED ORDER — FREESTYLE LIBRE 3 SENSOR MISC: 1.0000 | 2 refills | Status: DC

## 2021-09-24 MED ORDER — TRESIBA FLEXTOUCH 100 UNIT/ML ~~LOC~~ SOPN: 20.0000 [IU] | PEN_INJECTOR | Freq: Every day | SUBCUTANEOUS | 1 refills | Status: DC

## 2021-09-24 MED ORDER — ACCU-CHEK GUIDE VI STRP: ORAL_STRIP | 2 refills | Status: AC

## 2021-09-24 MED ORDER — BD PEN NEEDLE NANO U/F 32G X 4 MM MISC: 1.0000 | Freq: Four times a day (QID) | 1 refills | Status: DC

## 2021-09-24 NOTE — Telephone Encounter (Signed)
Pt left a VM stating the pharmacy would not fill her insulin

## 2021-09-24 NOTE — Progress Notes (Signed)
09/24/2021, 12:58 PM  Endocrinology follow-up note   Subjective:    Patient ID: Michele Myers, female    DOB: Mar 03, 1970.  Michele Myers is being seen in follow-up after she was seen in consultation for management of currently uncontrolled symptomatic diabetes requested by  Leonie Douglas, MD.   Past Medical History:  Diagnosis Date   Angina at rest [facility])    Backache    Bipolar 1 disorder, depressed ([place])    Diabetes mellitus without complication ([place])    Diabetes mellitus, type II ([place])    Dizzy spells    Glucosuria    History of seizures    Hyperlipidemia    Hypertension    Menopausal hot flushes    PTSD (post-traumatic stress disorder)    Tachycardia     Past Surgical History:  Procedure Laterality Date   ABDOMINAL HYSTERECTOMY     APPENDECTOMY     CARPAL TUNNEL RELEASE     CESAREAN SECTION     CHOLECYSTECTOMY     ROTATOR CUFF REPAIR      Social History   Socioeconomic History   Marital status: Single    Spouse name: Not on file   Number of children: Not on file   Years of education: Not on file   Highest education level: Not on file  Occupational History   Not on file  Tobacco Use   Smoking status: Former    Types: Cigarettes    Quit date: 09/10/2020    Years since quitting: 1.0   Smokeless tobacco: Never  Vaping Use   Vaping Use: Every day  Substance and Sexual Activity   Alcohol use: Never   Drug use: Never   Sexual activity: Not on file  Other Topics Concern   Not on file  Social History Narrative   Not on file   Social Determinants of Health   Financial Resource Strain: Not on file  Food Insecurity: Not on file  Transportation Needs: Not on file  Physical Activity: Not on file  Stress: Not on file  Social Connections: Not on file    Family History  Problem Relation Age of Onset   Cancer Mother    Kidney disease Mother    Thyroid disease Mother     Diabetes Mother    Hyperlipidemia Mother    Osteoporosis Mother    Cancer Father    Hyperlipidemia Father    Hypertension Father    Heart attack Father    Breast cancer Sister    Breast cancer Maternal Aunt     Outpatient Encounter Medications as of 09/24/2021  Medication Sig   Continuous Blood Gluc Sensor (FREESTYLE LIBRE 3 SENSOR) MISC 1 Piece by Does not apply route every 14 (fourteen) days. Place 1 sensor on the skin every 14 days. Use to check glucose continuously   insulin degludec (TRESIBA FLEXTOUCH) 100 UNIT/ML FlexTouch Pen Inject 20 Units into the skin at bedtime.   Insulin Pen Needle (BD PEN NEEDLE NANO U/F) 32G X 4 MM MISC 1 each by Does not apply route 4 (four) times daily.   methocarbamol (ROBAXIN) 500 MG tablet Take 2 tablets by mouth daily.  atorvastatin (LIPITOR) 20 MG tablet Take 20 mg by mouth daily.   Blood Glucose Monitoring Suppl (ACCU-CHEK GUIDE ME) w/Device KIT 1 Piece by Does not apply route as directed.   buPROPion (WELLBUTRIN SR) 150 MG 12 hr tablet Take 150 mg by mouth 2 (two) times daily.   FLUoxetine (PROZAC) 20 MG capsule Take 20 mg by mouth daily.   glipiZIDE (GLUCOTROL XL) 2.5 MG 24 hr tablet Take 2 tablets (5 mg total) by mouth daily with breakfast.   glucose blood (ACCU-CHEK GUIDE) test strip USE TO CHECK BOOD GLUCOSE  4 TIMES DAILY   lisinopril (ZESTRIL) 5 MG tablet Take 5 mg by mouth daily.   nitroGLYCERIN (NITROSTAT) 0.4 MG SL tablet Place 0.4 mg under the tongue every 5 (five) minutes as needed for chest pain.   [DISCONTINUED] glucose blood (ACCU-CHEK GUIDE) test strip USE TO CHECK BOOD GLUCOSE  4 TIMES DAILY   [DISCONTINUED] metFORMIN (GLUCOPHAGE) 500 MG tablet Take 1 tablet by mouth 2 (two) times daily.   [DISCONTINUED] sitaGLIPtin (JANUVIA) 50 MG tablet Take 1 tablet (50 mg total) by mouth daily.   No facility-administered encounter medications on file as of 09/24/2021.    ALLERGIES: Allergies  Allergen Reactions   Codeine Anxiety    Rapid  heart beat    VACCINATION STATUS:  There is no immunization history on file for this patient.  Diabetes She presents for her follow-up diabetic visit. She has type 2 diabetes mellitus. Onset time: she was diagnosed at approximate age of 51 yrs. Her disease course has been worsening. There are no hypoglycemic associated symptoms. Pertinent negatives for hypoglycemia include no headaches, seizures or tremors. Pertinent negatives for diabetes include no blurred vision, no chest pain, no polydipsia and no polyuria. There are no hypoglycemic complications. Symptoms are worsening. There are no diabetic complications. Risk factors for coronary artery disease include dyslipidemia, diabetes mellitus, family history, sedentary lifestyle and tobacco exposure. Current diabetic treatment includes oral agent (monotherapy). Her weight is decreasing steadily. She is following a generally unhealthy diet. When asked about meal planning, she reported none. She has not had a previous visit with a dietitian. She never participates in exercise. Her home blood glucose trend is increasing rapidly. Her overall blood glucose range is >200 mg/dl. (She presents with worsening glycemic profile, her point-of-care A1c is 14.5%.  Her meter has only 3 readings averaging 453.  She did not document any hypoglycemia.    ) An ACE inhibitor/angiotensin II receptor blocker is being taken. Eye exam is current.  Hyperlipidemia This is a chronic problem. The current episode started more than 1 year ago. The problem is uncontrolled. Pertinent negatives include no chest pain, myalgias or shortness of breath. Current antihyperlipidemic treatment includes statins. Risk factors for coronary artery disease include diabetes mellitus, dyslipidemia, family history, hypertension and a sedentary lifestyle.  Hypertension This is a chronic problem. The current episode started more than 1 year ago. Pertinent negatives include no blurred vision, chest pain,  headaches, palpitations or shortness of breath. Risk factors for coronary artery disease include diabetes mellitus, dyslipidemia, sedentary lifestyle and smoking/tobacco exposure. Past treatments include ACE inhibitors.     Review of Systems  Constitutional:  Negative for chills and fever.  Eyes:  Negative for blurred vision.  Respiratory:  Negative for cough and shortness of breath.   Cardiovascular:  Negative for chest pain and palpitations.       No Shortness of breath  Gastrointestinal:  Negative for abdominal pain, diarrhea, nausea and vomiting.  Endocrine:  Negative for polydipsia and polyuria.  Genitourinary:  Negative for frequency, hematuria and urgency.  Musculoskeletal:  Negative for myalgias.  Skin:  Negative for rash.  Neurological:  Negative for tremors, seizures and headaches.  Hematological:  Does not bruise/bleed easily.  Psychiatric/Behavioral:  Negative for hallucinations and suicidal ideas.     Objective:       09/24/2021    8:46 AM 03/12/2021    3:36 PM 12/10/2020    3:02 PM  Vitals with BMI  Height 4' 11"  4' 11"  4' 11"   Weight 127 lbs 6 oz 144 lbs 10 oz 132 lbs 6 oz  BMI 25.72 00.92 33.00  Systolic 762 263 335  Diastolic 68 82 76  Pulse 64 80 72    BP 110/68   Pulse 64   Ht 4' 11"  (1.499 m)   Wt 127 lb 6.4 oz (57.8 kg)   BMI 25.73 kg/m   Wt Readings from Last 3 Encounters:  09/24/21 127 lb 6.4 oz (57.8 kg)  03/12/21 144 lb 9.6 oz (65.6 kg)  12/10/20 132 lb 6.4 oz (60.1 kg)         CMP ( most recent) CMP     Component Value Date/Time   NA 129 (A) 07/01/2021 0000   K 5.4 (A) 07/01/2021 0000   CL 93 (A) 07/01/2021 0000   CO2 23 (A) 07/01/2021 0000   GLUCOSE 311 (H) 12/18/2019 0858   BUN 25 (A) 07/01/2021 0000   CREATININE 1.1 07/01/2021 0000   CREATININE 0.50 12/18/2019 0858   CALCIUM 10.2 07/01/2021 0000   PROT 6.8 12/18/2019 0849   ALBUMIN 4.2 07/01/2021 0000   AST 21 12/18/2019 0849   ALT 11 07/01/2021 0000   ALKPHOS 98 07/01/2021  0000   BILITOT 0.7 12/18/2019 0849   GFRNONAA >60 12/18/2019 0849     Diabetic Labs (most recent): Lab Results  Component Value Date   HGBA1C 14.5 (A) 09/24/2021   HGBA1C >14 07/01/2021   HGBA1C 8.5 (A) 03/12/2021   MICROALBUR <0.2 10/08/2020     Lipid Panel ( most recent) Lipid Panel     Component Value Date/Time   CHOL 320 (A) 07/01/2021 0000   TRIG 532 (A) 07/01/2021 0000   HDL 50 07/01/2021 0000   LDLCALC 189 07/01/2021 0000      Lab Results  Component Value Date   TSH 0.20 (A) 07/01/2021   TSH 0.06 (A) 10/08/2020       Assessment & Plan:   1. Type 2 diabetes mellitus with hyperglycemia, without long-term current use of insulin ([place])   - Michele Myers has currently uncontrolled symptomatic type 2 DM since  51 years of age. She presents with worsening glycemic profile, her point-of-care A1c is 14.5%.  Her meter has only 3 readings averaging 453.  She did not document any hypoglycemia.      Recent labs reviewed. - I had a long discussion with her about the progressive nature of diabetes and the pathology behind its complications. 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 These are all discussed in detail with her.  - I have counseled her on diet  and weight management  by adopting a carbohydrate restricted/protein rich diet. Patient is encouraged to switch to  unprocessed or minimally processed     complex starch and increased protein intake (animal or plant source), fruits,  and vegetables. -  she is advised to stick to a routine mealtimes to eat 3 meals  a day and avoid unnecessary snacks ( to snack only to correct hypoglycemia).   - she acknowledges that there is a room for improvement in her food and drink choices. - Suggestion is made for her to avoid simple carbohydrates  from her diet  including Cakes, Sweet Desserts, Ice Cream, Soda (diet and regular), Sweet Tea, Candies, Chips, Cookies, Store Bought Juices, Alcohol in Excess of  1-2 drinks a day, Artificial Sweeteners,  Coffee Creamer, and "Sugar-free" Products, Lemonade. This will help patient to have more stable blood glucose profile and potentially avoid unintended weight gain.  - she will be scheduled with Jearld Fenton, RDN, CDE for diabetes education.  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  She will need insulin treatment in order for her to achieve control of diabetes to target.  I discussed and initiated basal insulin Tresiba 20 units nightly.  Insulin use was demonstrated  in the exam with a sample in the room for her. She is not a suitable candidate for metformin or GLP-1 receptor agonist.  She is advised to continue glipizide 5 mg XL p.o. daily at breakfast.   -She is willing and advised to continue monitoring blood glucose 4 times a day-daily before meals and at bedtime.  This patient will benefit from a CGM.  I discussed and prescribed the freestyle libre device for her.    - she is encouraged to call clinic for blood glucose levels less than 70 or above 200 mg /dl.    - Specific targets for  A1c;  LDL, HDL,  and Triglycerides were discussed with the patient.  2) Blood Pressure /Hypertension:  Her blood pressure is controlled to target.  she is advised to continue her current medications including lisinopril 5 mg p.o. daily with breakfast . 3) Lipids/Hyperlipidemia:   Review of her recent lipid panel showed uncontrolled  LDL at 151.  she  is advised to continue atorvastatin 20 mg p.o. daily at bedtime.   Side effects and precautions discussed with her.  4)  Weight/Diet:  Body mass index is 25.73 kg/m.  -    she is not a  candidate for weight loss.  [employer]- Lifestyle Nutrition is discussed and recommended to the patient. Optimal Exercise, Restorative Sleep  information was detailed on discharge instructions. If she continues to lose weight unintentionally, she will be considered for creon therapy.  5) Chronic Care/Health Maintenance:  -she  is on ACEI/ARB and Statin medications and  is encouraged to initiate and continue to follow up with Ophthalmology, Dentist,  Podiatrist at least yearly or according to recommendations, and advised to  quit smoking. I have recommended yearly flu vaccine and pneumonia vaccine at least every 5 years; moderate intensity exercise for up to 150 minutes weekly; and  sleep for 7- 9 hours a day.  The patient was counseled on the dangers of tobacco use, and was advised to quit.  Reviewed strategies to maximize success, including removing cigarettes and smoking materials from environment.    - she is  advised to maintain close follow up with Leonie Douglas, MD for primary care needs, as well as her other providers for optimal and coordinated care.  I spent 40 minutes in the  care of the patient today including review of labs from [place], Lipids, Thyroid Function, Hematology (current and previous including abstractions from other facilities); face-to-face time discussing  her blood glucose readings/logs, discussing hypoglycemia and hyperglycemia episodes and symptoms, medications doses, her options of short and long term treatment based on the latest standards of care / guidelines;  discussion about incorporating lifestyle medicine;  and documenting the encounter. Risk reduction counseling performed per USPSTF guidelines to reduce cardiovascular risk factors.     Please refer to Patient Instructions for Blood Glucose Monitoring and Insulin/Medications Dosing Guide"  in media tab for additional information. Please  also refer to " Patient Self Inventory" in the Media  tab  for reviewed elements of pertinent patient history.  Michele Myers participated in the discussions, expressed understanding, and voiced agreement with the above plans.  All questions were answered to her satisfaction. she is encouraged to contact clinic should she have any questions or concerns prior to her return visit.  Follow up plan: - Return in about 10 days (around 10/04/2021) for F/U with Meter/CGM [facility] Only - no Labs.  Glade Lloyd, MD [facility] [address] [place], [place] [address] Phone: [phone]  Fax: [phone]    09/24/2021, 12:58 PM  This note was partially dictated with voice recognition software. Similar sounding words can be transcribed inadequately or may not  be corrected upon review.

## 2021-09-24 NOTE — Patient Instructions (Signed)

## 2021-09-27 ENCOUNTER — Other Ambulatory Visit: Payer: Self-pay | Admitting: "Endocrinology

## 2021-09-27 MED ORDER — BASAGLAR KWIKPEN 100 UNIT/ML ~~LOC~~ SOPN: 20.0000 [IU] | PEN_INJECTOR | Freq: Every day | SUBCUTANEOUS | 2 refills | Status: DC

## 2021-09-27 NOTE — Telephone Encounter (Signed)
Pt left a VM stating she really needs her insulin refilled. She said whatever was initially called in was denied so needs something in place of it

## 2021-10-07 ENCOUNTER — Encounter: Payer: Self-pay | Admitting: "Endocrinology

## 2021-10-07 ENCOUNTER — Ambulatory Visit (INDEPENDENT_AMBULATORY_CARE_PROVIDER_SITE_OTHER): Payer: Self-pay | Admitting: "Endocrinology

## 2021-10-07 VITALS — BP 114/82 | HR 72 | Ht 59.0 in | Wt 128.8 lb

## 2021-10-07 DIAGNOSIS — E782 Mixed hyperlipidemia: Secondary | ICD-10-CM

## 2021-10-07 DIAGNOSIS — I10 Essential (primary) hypertension: Secondary | ICD-10-CM

## 2021-10-07 DIAGNOSIS — E1165 Type 2 diabetes mellitus with hyperglycemia: Secondary | ICD-10-CM

## 2021-10-07 DIAGNOSIS — E0590 Thyrotoxicosis, unspecified without thyrotoxic crisis or storm: Secondary | ICD-10-CM

## 2021-10-07 MED ORDER — BASAGLAR KWIKPEN 100 UNIT/ML ~~LOC~~ SOPN: 40.0000 [IU] | PEN_INJECTOR | Freq: Every day | SUBCUTANEOUS | 2 refills | Status: DC

## 2021-10-07 NOTE — Patient Instructions (Signed)

## 2021-10-07 NOTE — Progress Notes (Signed)
10/07/2021, 9:30 AM  Endocrinology follow-up note   Subjective:    Patient ID: Michele Myers, female    DOB: [birth year].  Michele Myers is being seen in follow-up after she was seen in consultation for management of currently uncontrolled symptomatic diabetes requested by  Leonie Douglas, MD.   Past Medical History:  Diagnosis Date   Angina at rest    Backache    Bipolar 1 disorder, depressed ([place])    Diabetes mellitus without complication ([place])    Diabetes mellitus, type II ([place])    Dizzy spells    Glucosuria    History of seizures    Hyperlipidemia    Hypertension    Menopausal hot flushes    PTSD (post-traumatic stress disorder)    Tachycardia     Past Surgical History:  Procedure Laterality Date   ABDOMINAL HYSTERECTOMY     APPENDECTOMY     CARPAL TUNNEL RELEASE     CESAREAN SECTION     CHOLECYSTECTOMY     ROTATOR CUFF REPAIR      Social History   Socioeconomic History   Marital status: Single    Spouse name: Not on file   Number of children: Not on file   Years of education: Not on file   Highest education level: Not on file  Occupational History   Not on file  Tobacco Use   Smoking status: Former    Types: Cigarettes    Quit date: 09/10/2020    Years since quitting: 1.0   Smokeless tobacco: Never  Vaping Use   Vaping Use: Every day  Substance and Sexual Activity   Alcohol use: Never   Drug use: Never   Sexual activity: Not on file  Other Topics Concern   Not on file  Social History Narrative   Not on file   Social Determinants of Health   Financial Resource Strain: Not on file  Food Insecurity: Not on file  Transportation Needs: Not on file  Physical Activity: Not on file  Stress: Not on file  Social Connections: Not on file    Family History  Problem Relation Age of Onset   Cancer Mother    Kidney disease Mother    Thyroid disease Mother    Diabetes  Mother    Hyperlipidemia Mother    Osteoporosis Mother    Cancer Father    Hyperlipidemia Father    Hypertension Father    Heart attack Father    Breast cancer Sister    Breast cancer Maternal Aunt     Outpatient Encounter Medications as of 10/07/2021  Medication Sig   atorvastatin (LIPITOR) 20 MG tablet Take 20 mg by mouth daily.   Blood Glucose Monitoring Suppl (ACCU-CHEK GUIDE ME) w/Device KIT 1 Piece by Does not apply route as directed.   buPROPion (WELLBUTRIN SR) 150 MG 12 hr tablet Take 150 mg by mouth 2 (two) times daily.   Continuous Blood Gluc Sensor (FREESTYLE LIBRE 3 SENSOR) MISC 1 Piece by Does not apply route every 14 (fourteen) days. Place 1 sensor on the skin every 14 days. Use to check glucose continuously   FLUoxetine (PROZAC) 20 MG  capsule Take 20 mg by mouth daily.   glipiZIDE (GLUCOTROL XL) 2.5 MG 24 hr tablet Take 2 tablets (5 mg total) by mouth daily with breakfast.   glucose blood (ACCU-CHEK GUIDE) test strip USE TO CHECK BOOD GLUCOSE  4 TIMES DAILY   Insulin Glargine (BASAGLAR KWIKPEN) 100 UNIT/ML Inject 40 Units into the skin at bedtime.   Insulin Pen Needle (BD PEN NEEDLE NANO U/F) 32G X 4 MM MISC 1 each by Does not apply route 4 (four) times daily.   lisinopril (ZESTRIL) 5 MG tablet Take 5 mg by mouth daily.   methocarbamol (ROBAXIN) 500 MG tablet Take 2 tablets by mouth daily.   nitroGLYCERIN (NITROSTAT) 0.4 MG SL tablet Place 0.4 mg under the tongue every 5 (five) minutes as needed for chest pain.   [DISCONTINUED] Insulin Glargine (BASAGLAR KWIKPEN) 100 UNIT/ML Inject 20 Units into the skin at bedtime.   No facility-administered encounter medications on file as of 10/07/2021.    ALLERGIES: Allergies  Allergen Reactions   Codeine Anxiety    Rapid heart beat    VACCINATION STATUS:  There is no immunization history on file for this patient.  Diabetes She presents for her follow-up diabetic visit. She has type 2 diabetes mellitus. Onset time: she was  diagnosed at approximate age of 65 yrs. Her disease course has been worsening. There are no hypoglycemic associated symptoms. Pertinent negatives for hypoglycemia include no headaches, seizures or tremors. Pertinent negatives for diabetes include no blurred vision, no chest pain, no polydipsia and no polyuria. There are no hypoglycemic complications. Symptoms are worsening. There are no diabetic complications. Risk factors for coronary artery disease include dyslipidemia, diabetes mellitus, family history, sedentary lifestyle and tobacco exposure. Current diabetic treatment includes oral agent (monotherapy). Her weight is decreasing steadily. She is following a generally unhealthy diet. When asked about meal planning, she reported none. She has not had a previous visit with a dietitian. She never participates in exercise. Her home blood glucose trend is increasing rapidly. Her breakfast blood glucose range is generally >200 mg/dl. Her lunch blood glucose range is generally >200 mg/dl. Her dinner blood glucose range is generally >200 mg/dl. Her bedtime blood glucose range is generally >200 mg/dl. Her overall blood glucose range is >200 mg/dl. (She received a CGM, freestyle libre 3.  She presents with her device showing 16% time range, 82% above range.  She has no significant hypoglycemia.  Her average blood glucose for the last 10 days is 241-250.    ) An ACE inhibitor/angiotensin II receptor blocker is being taken. Eye exam is current.  Hyperlipidemia This is a chronic problem. The current episode started more than 1 year ago. The problem is uncontrolled. Pertinent negatives include no chest pain, myalgias or shortness of breath. Current antihyperlipidemic treatment includes statins. Risk factors for coronary artery disease include diabetes mellitus, dyslipidemia, family history, hypertension and a sedentary lifestyle.  Hypertension This is a chronic problem. The current episode started more than 1 year ago.  Pertinent negatives include no blurred vision, chest pain, headaches, palpitations or shortness of breath. Risk factors for coronary artery disease include diabetes mellitus, dyslipidemia, sedentary lifestyle and smoking/tobacco exposure. Past treatments include ACE inhibitors.     Review of Systems  Constitutional:  Negative for chills and fever.  Eyes:  Negative for blurred vision.  Respiratory:  Negative for cough and shortness of breath.   Cardiovascular:  Negative for chest pain and palpitations.       No Shortness of breath  Gastrointestinal:  Negative for abdominal pain, diarrhea, nausea and vomiting.  Endocrine: Negative for polydipsia and polyuria.  Genitourinary:  Negative for frequency, hematuria and urgency.  Musculoskeletal:  Negative for myalgias.  Skin:  Negative for rash.  Neurological:  Negative for tremors, seizures and headaches.  Hematological:  Does not bruise/bleed easily.  Psychiatric/Behavioral:  Negative for hallucinations and suicidal ideas.     Objective:       10/07/2021    8:52 AM 09/24/2021    8:46 AM 03/12/2021    3:36 PM  Vitals with BMI  Height 4' 11"  4' 11"  4' 11"   Weight 128 lbs 13 oz 127 lbs 6 oz 144 lbs 10 oz  BMI 26 16.07 37.10  Systolic 626 948 546  Diastolic 82 68 82  Pulse 72 64 80    BP 114/82   Pulse 72   Ht 4' 11"  (1.499 m)   Wt 128 lb 12.8 oz (58.4 kg)   BMI 26.01 kg/m   Wt Readings from Last 3 Encounters:  10/07/21 128 lb 12.8 oz (58.4 kg)  09/24/21 127 lb 6.4 oz (57.8 kg)  03/12/21 144 lb 9.6 oz (65.6 kg)         CMP ( most recent) CMP     Component Value Date/Time   NA 129 (A) 07/01/2021 0000   K 5.4 (A) 07/01/2021 0000   CL 93 (A) 07/01/2021 0000   CO2 23 (A) 07/01/2021 0000   GLUCOSE 311 (H) 12/18/2019 0858   BUN 25 (A) 07/01/2021 0000   CREATININE 1.1 07/01/2021 0000   CREATININE 0.50 12/18/2019 0858   CALCIUM 10.2 07/01/2021 0000   PROT 6.8 12/18/2019 0849   ALBUMIN 4.2 07/01/2021 0000   AST 21  12/18/2019 0849   ALT 11 07/01/2021 0000   ALKPHOS 98 07/01/2021 0000   BILITOT 0.7 12/18/2019 0849   GFRNONAA >60 12/18/2019 0849     Diabetic Labs (most recent): Lab Results  Component Value Date   HGBA1C 14.5 (A) 09/24/2021   HGBA1C >14 07/01/2021   HGBA1C 8.5 (A) 03/12/2021   MICROALBUR <0.2 10/08/2020     Lipid Panel ( most recent) Lipid Panel     Component Value Date/Time   CHOL 320 (A) 07/01/2021 0000   TRIG 532 (A) 07/01/2021 0000   HDL 50 07/01/2021 0000   LDLCALC 189 07/01/2021 0000      Lab Results  Component Value Date   TSH 0.20 (A) 07/01/2021   TSH 0.06 (A) 10/08/2020       Assessment & Plan:   1. Type 2 diabetes mellitus with hyperglycemia, without long-term current use of insulin ([place])   - Seniyah Esker Gorter has currently uncontrolled symptomatic type 2 DM since  51 years of age.  She received a CGM, freestyle libre 3.  She presents with her device showing 16% time range, 82% above range.  She has no significant hypoglycemia.  Her average blood glucose for the last 10 days is 241-250.     Recent labs reviewed. - I had a long discussion with her about the progressive nature of diabetes and the pathology behind its complications. 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 These are all discussed in detail with her.  - I have counseled her on diet  and weight management  by adopting a carbohydrate restricted/protein rich diet. Patient is encouraged to switch  to  unprocessed or minimally processed     complex starch and increased protein intake (animal or plant source), fruits, and vegetables. -  she is advised to stick to a routine mealtimes to eat 3 meals  a day and avoid unnecessary snacks ( to snack only to correct hypoglycemia).   - she acknowledges that there is a room for  improvement in her food and drink choices. - Suggestion is made for her to avoid simple carbohydrates  from her diet including Cakes, Sweet Desserts, Ice Cream, Soda (diet and regular), Sweet Tea, Candies, Chips, Cookies, Store Bought Juices, Alcohol in Excess of  1-2 drinks a day, Artificial Sweeteners,  Coffee Creamer, and "Sugar-free" Products, Lemonade. This will help patient to have more stable blood glucose profile and potentially avoid unintended weight gain.  - she will be scheduled with Jearld Fenton, RDN, CDE for diabetes education.  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  She is slowly responding to the vaccine.  I discussed and increase her Basaglar to 40 units nightly, urged to use her CGM continuously.   She may need prandial insulin during subsequent visits. She is not a suitable candidate for metformin or GLP-1 receptor agonist.  She is advised to continue glipizide 5 mg XL p.o. daily at breakfast.   - she is encouraged to call clinic for blood glucose levels less than 70 or above 200 mg /dl.    - Specific targets for  A1c;  LDL, HDL,  and Triglycerides were discussed with the patient.  2) Blood Pressure /Hypertension:  -Her blood pressure is controlled to target.  she is advised to continue her current medications including lisinopril 5 mg p.o. daily with breakfast . 3) Lipids/Hyperlipidemia:   Review of her recent lipid panel showed uncontrolled  LDL at 151.  she  is advised to continue atorvastatin 20 mg p.o. daily at bedtime.    Side effects and precautions discussed with her.  4)  Weight/Diet:  Body mass index is 26.01 kg/m.  -    she is not a candidate for weight loss.  [employer] , [employer]- Lifestyle Nutrition is discussed and recommended to the patient. Optimal Exercise,  Restorative Sleep  information was detailed on discharge instructions. If she continues to lose weight unintentionally, she will be considered for creon therapy.  5) Chronic Care/Health Maintenance:  -she  is on ACEI/ARB and Statin medications and  is encouraged to initiate and continue to follow up with Ophthalmology, Dentist,  Podiatrist at least yearly or according to recommendations, and advised to  quit smoking. I have recommended yearly flu vaccine and pneumonia vaccine at least every 5 years; moderate intensity exercise for up to 150 minutes weekly; and  sleep for 7- 9 hours a day.  The patient was counseled on the dangers of tobacco use, and was advised to quit.  Reviewed strategies to maximize success, including removing cigarettes and smoking materials from environment.    - she is  advised to maintain close follow up with Leonie Douglas, MD for primary care needs, as well as her other providers for optimal and coordinated care.   I spent 31 minutes in the care of the patient today including review of labs from [facility], Lipids, Thyroid Function, Hematology (current and previous including abstractions from other facilities); face-to-face time discussing  her  blood glucose readings/logs, discussing hypoglycemia and hyperglycemia episodes and symptoms, medications doses, her options of short and long term treatment based on the latest standards of care / guidelines;  discussion about incorporating lifestyle medicine;  and documenting the encounter. Risk reduction counseling performed per USPSTF guidelines to reduce  cardiovascular risk factors.     Please refer to Patient Instructions for Blood Glucose Monitoring and Insulin/Medications Dosing Guide"  in media tab for additional information. Please  also refer to " Patient Self Inventory" in the Media  tab for reviewed elements of pertinent patient history.  Kyra Manges Faerber participated in the discussions, expressed understanding, and voiced  agreement with the above plans.  All questions were answered to her satisfaction. she is encouraged to contact clinic should she have any questions or concerns prior to her return visit.   Follow up plan: - Return in about 3 months (around 01/07/2022) for F/U with Pre-visit Labs, Meter/CGM/Logs, A1c here.  Glade Lloyd, MD [facility] [address] [place], [place] [address] Phone: [phone]  Fax: [phone]    10/07/2021, 9:30 AM  This note was partially dictated with voice recognition software. Similar sounding words can be transcribed inadequately or may not  be corrected upon review.

## 2021-10-08 ENCOUNTER — Other Ambulatory Visit (HOSPITAL_COMMUNITY): Payer: Self-pay | Admitting: Sports Medicine

## 2021-10-08 DIAGNOSIS — Z1231 Encounter for screening mammogram for malignant neoplasm of breast: Secondary | ICD-10-CM

## 2021-10-18 ENCOUNTER — Encounter (HOSPITAL_COMMUNITY): Payer: Self-pay

## 2021-10-22 DIAGNOSIS — R079 Chest pain, unspecified: Secondary | ICD-10-CM | POA: Diagnosis not present

## 2021-11-21 ENCOUNTER — Ambulatory Visit (HOSPITAL_COMMUNITY): Payer: BC Managed Care – PPO

## 2021-11-21 ENCOUNTER — Encounter (HOSPITAL_COMMUNITY): Payer: Self-pay

## 2021-12-10 ENCOUNTER — Encounter: Payer: Self-pay | Admitting: *Deleted

## 2021-12-11 ENCOUNTER — Encounter: Payer: Self-pay | Admitting: Internal Medicine

## 2022-01-07 ENCOUNTER — Ambulatory Visit: Payer: BC Managed Care – PPO | Admitting: "Endocrinology

## 2022-02-01 DIAGNOSIS — R0789 Other chest pain: Secondary | ICD-10-CM | POA: Diagnosis not present

## 2022-02-01 DIAGNOSIS — J069 Acute upper respiratory infection, unspecified: Secondary | ICD-10-CM | POA: Diagnosis not present

## 2022-02-01 DIAGNOSIS — Z20822 Contact with and (suspected) exposure to covid-19: Secondary | ICD-10-CM | POA: Diagnosis not present

## 2022-02-01 DIAGNOSIS — Z720 Tobacco use: Secondary | ICD-10-CM | POA: Diagnosis not present

## 2022-02-01 DIAGNOSIS — R079 Chest pain, unspecified: Secondary | ICD-10-CM | POA: Diagnosis not present

## 2022-02-03 ENCOUNTER — Encounter: Payer: Self-pay | Admitting: Gastroenterology

## 2022-02-03 ENCOUNTER — Ambulatory Visit (INDEPENDENT_AMBULATORY_CARE_PROVIDER_SITE_OTHER): Payer: 59 | Admitting: Gastroenterology

## 2022-02-03 ENCOUNTER — Telehealth: Payer: Self-pay | Admitting: *Deleted

## 2022-02-03 VITALS — BP 129/81 | HR 79 | Temp 98.2°F | Ht 59.0 in | Wt 148.2 lb

## 2022-02-03 DIAGNOSIS — K529 Noninfective gastroenteritis and colitis, unspecified: Secondary | ICD-10-CM

## 2022-02-03 DIAGNOSIS — R159 Full incontinence of feces: Secondary | ICD-10-CM | POA: Insufficient documentation

## 2022-02-03 NOTE — Patient Instructions (Addendum)
Please complete labs today at [facility], [address], [place]. Colonoscopy to be scheduled. See separate instructions.

## 2022-02-03 NOTE — Telephone Encounter (Signed)
LMTRC  Need to schedule TCS w/Dr.Carver, ASA 3 

## 2022-02-03 NOTE — Progress Notes (Signed)
GI Office Note    Referring Provider: Coolidge Breeze, FNP Primary Care Physician:  Coolidge Breeze, FNP  Primary Gastroenterologist: Elon Alas. Abbey Chatters, DO   Chief Complaint   Chief Complaint  Patient presents with   Diarrhea    Also having issues with fecal incontinence. States that it has been going on for a couple of months, has to wear depends.    Colonoscopy     History of Present Illness   Michele Myers is a 52 y.o. female presenting today at the request of Suzzanne Cloud, FNP for further evaluation of fecal incontinence and need for colonoscopy.  Patient seen in the ED 2 days ago with atypical chest pain, sore throat suspected viral syndrome.  Respiratory panel negative for COVID, influenza, RSV. She is feeling better today.   Labs from February 01, 2022: White blood cell count 10,900, hemoglobin 13.1, platelets 312,000, total bilirubin 0.2, alkaline phosphatase 92, AST 14, ALT 26, albumin 3.5, creatinine 1.04, lipase 51  Chest x-ray negative on February 01, 2022.  She complains of postprandial loose stools for several years. Stools often watery. At least 4-5 times per day. Some nocturnal diarrhea. Over the past one year she has had episodes of intermittent fecal incontinence worse with urgency. Sometimes can't make it to the bathroom on time, sometimes happens without warning. Has not noticed any particular food triggers. Currently she feels constipated, last stool 2 days ago, this is rare for her. Occasional use of imodium, but none in awhile. No blood in stool. Symptoms often associated with abdominal cramps. No weight loss. Reports 20 pound weight gain since starting insulin few months ago. Heartburn controlled on omeprazole.  She has history of remote etoh abuse lasting for about 9 years, but sober since 1998.  09/2021: A1C 14.5 06/2021: TSH 0.20  Medications   Current Outpatient Medications  Medication Sig Dispense Refill   atorvastatin (LIPITOR) 20 MG tablet Take  20 mg by mouth daily.     Blood Glucose Monitoring Suppl (ACCU-CHEK GUIDE ME) w/Device KIT 1 Piece by Does not apply route as directed. 1 kit 0   buPROPion (WELLBUTRIN SR) 150 MG 12 hr tablet Take 150 mg by mouth 2 (two) times daily.     Continuous Blood Gluc Receiver (DEXCOM G7 RECEIVER) DEVI USE FOR CONTINUOUSE BLOOD GLUCOSE MONITORING.     FLUoxetine (PROZAC) 20 MG capsule Take 20 mg by mouth daily.     glipiZIDE (GLUCOTROL XL) 2.5 MG 24 hr tablet Take 2 tablets (5 mg total) by mouth daily with breakfast. 180 tablet 0   glucose blood (ACCU-CHEK GUIDE) test strip USE TO CHECK BOOD GLUCOSE  4 TIMES DAILY 150 strip 2   Insulin Glargine (BASAGLAR KWIKPEN) 100 UNIT/ML Inject 40 Units into the skin at bedtime. 15 mL 2   Insulin Pen Needle (BD PEN NEEDLE NANO U/F) 32G X 4 MM MISC 1 each by Does not apply route 4 (four) times daily. 150 each 1   lisinopril (ZESTRIL) 20 MG tablet Take 20 mg by mouth daily.     methocarbamol (ROBAXIN) 500 MG tablet Take 500 mg by mouth 2 (two) times daily.     nitroGLYCERIN (NITROSTAT) 0.4 MG SL tablet Place 0.4 mg under the tongue every 5 (five) minutes as needed for chest pain.     omeprazole (PRILOSEC) 40 MG capsule Take 40 mg by mouth every morning.     ondansetron (ZOFRAN-ODT) 4 MG disintegrating tablet Take by mouth.  No current facility-administered medications for this visit.    Allergies   Allergies as of 02/03/2022 - Review Complete 02/03/2022  Allergen Reaction Noted   Codeine Anxiety 11/28/2020    Past Medical History   Past Medical History:  Diagnosis Date   Angina at rest    Backache    Bipolar 1 disorder, depressed ([facility])    Diabetes mellitus without complication ([facility])    Diabetes mellitus, type II ([facility])    Dizzy spells    Glucosuria    History of seizures    Hyperlipidemia    Hypertension    Menopausal hot flushes    PTSD (post-traumatic stress disorder)    Tachycardia     Past Surgical History   Past Surgical History:   Procedure Laterality Date   ABDOMINAL HYSTERECTOMY     APPENDECTOMY     CARPAL TUNNEL RELEASE     CESAREAN SECTION     3   CHOLECYSTECTOMY  01/2016   ROTATOR CUFF REPAIR      Past Family History   Family History  Problem Relation Age of Onset   Cancer Mother        thyroid   Kidney disease Mother    Thyroid disease Mother    Diabetes Mother    Hyperlipidemia Mother    Osteoporosis Mother    Cancer Father        prostate, small cell lung cancer   Hyperlipidemia Father    Hypertension Father    Heart attack Father    Breast cancer Sister    Ovarian cancer Sister    Throat cancer Sister    Heart failure Brother    Diabetes Brother    Kidney disease Brother    Pancreatic cancer Maternal Grandmother    Throat cancer Maternal Grandmother    Breast cancer Maternal Aunt    Cancer Maternal Uncle    Colon cancer Paternal Uncle        possible   Inflammatory bowel disease Neg Hx    Crohn's disease Neg Hx    Celiac disease Neg Hx     Past Social History   Social History   Socioeconomic History   Marital status: Single    Spouse name: Not on file   Number of children: Not on file   Years of education: Not on file   Highest education level: Not on file  Occupational History   Not on file  Tobacco Use   Smoking status: Former    Types: Cigarettes    Quit date: 09/10/2020    Years since quitting: 1.4   Smokeless tobacco: Current   Tobacco comments:    vapes  Vaping Use   Vaping Use: Every day  Substance and Sexual Activity   Alcohol use: Not Currently    Comment: history of etoh abuse for 9 years, clean since 1998   Drug use: Never   Sexual activity: Not Currently  Other Topics Concern   Not on file  Social History Narrative   Not on file   Social Determinants of Health   Financial Resource Strain: Not on file  Food Insecurity: Not on file  Transportation Needs: Not on file  Physical Activity: Not on file  Stress: Not on file  Social Connections: Not  on file  Intimate Partner Violence: Not on file    Review of Systems   General: Negative for anorexia, weight loss, fever, chills, fatigue, weakness. Eyes: Negative for vision changes.  ENT: Negative for hoarseness, difficulty swallowing ,  nasal congestion. CV: Negative for chest pain, angina, palpitations, dyspnea on exertion, peripheral edema.  Respiratory: Negative for dyspnea at rest, dyspnea on exertion, cough, sputum, wheezing.  GI: See history of present illness. GU:  Negative for dysuria, hematuria, urinary incontinence, urinary frequency, nocturnal urination.  MS: Negative for joint pain, low back pain.  Derm: Negative for rash or itching.  Neuro: Negative for weakness, abnormal sensation, seizure, frequent headaches, memory loss,  confusion.  Psych: Negative for anxiety, depression, suicidal ideation, hallucinations.  Endo: Negative for unusual weight change.  Heme: Negative for bruising or bleeding. Allergy: Negative for rash or hives.  Physical Exam   BP 129/81 (BP Location: Right Arm, Patient Position: Sitting, Cuff Size: Normal)   Pulse 79   Temp 98.2 F (36.8 C) (Oral)   Ht 4\' 11"  (1.499 m)   Wt 148 lb 3.2 oz (67.2 kg)   SpO2 99%   BMI 29.93 kg/m    General: Well-nourished, well-developed in no acute distress.  Head: Normocephalic, atraumatic.   Eyes: Conjunctiva pink, no icterus. Mouth: Oropharyngeal mucosa moist and pink , no lesions erythema or exudate. Neck: Supple without thyromegaly, masses, or lymphadenopathy.  Lungs: Clear to auscultation bilaterally.  Heart: Regular rate and rhythm, no murmurs rubs or gallops.  Abdomen: Bowel sounds are normal, nontender, nondistended, no hepatosplenomegaly or masses,  no abdominal bruits or hernia, no rebound or guarding.   Rectal: not performed Extremities: No lower extremity edema. No clubbing or deformities.  Neuro: Alert and oriented x 4 , grossly normal neurologically.  Skin: Warm and dry, no rash or  jaundice.   Psych: Alert and cooperative, normal mood and affect.  Labs   See hpi  Imaging Studies   No results found.  Assessment    Chronic diarrhea/fecal incontinence: screen for celiac disease. Plan for colonoscopy with possible random colon biopsies. Differential also includes exocrine pancreatic insufficiency given chronic etoh use.   PLAN   Colonoscopy in near future. ASA 3.  I have discussed the risks, alternatives, benefits with regards to but not limited to the risk of reaction to medication, bleeding, infection, perforation and the patient is agreeable to proceed. Written consent to be obtained. Celiac serologies, CRP.  Based on labs and colonoscopy, if no significant findings, consider trial of pancreatic enzymes.    Laureen Ochs. Bobby Rumpf, [place], [facility]

## 2022-02-04 LAB — TISSUE TRANSGLUTAMINASE, IGA: Transglutaminase IgA: <2 U/mL (ref 0–3)

## 2022-02-04 LAB — C-REACTIVE PROTEIN: CRP: 3 mg/L (ref 0–10)

## 2022-02-04 LAB — IGA: IgA/Immunoglobulin A, Serum: 298 mg/dL (ref 87–352)

## 2022-02-04 NOTE — Telephone Encounter (Signed)
Pt left vm returning call  LMTRC 

## 2022-02-05 ENCOUNTER — Other Ambulatory Visit: Payer: Self-pay | Admitting: *Deleted

## 2022-02-05 ENCOUNTER — Encounter: Payer: Self-pay | Admitting: *Deleted

## 2022-02-05 MED ORDER — PEG 3350-KCL-NA BICARB-NACL 420 G PO SOLR: 4000.0000 mL | Freq: Once | ORAL | 0 refills | Status: AC

## 2022-02-06 NOTE — Telephone Encounter (Signed)
Pt has been scheduled for 02/17/22. Instructions mailed and prep sent to the pharmacy

## 2022-02-12 ENCOUNTER — Telehealth: Payer: Self-pay | Admitting: *Deleted

## 2022-02-12 NOTE — Telephone Encounter (Signed)
noted 

## 2022-02-12 NOTE — Telephone Encounter (Signed)
Young, Butch Penny, Geroge Gilliam L, LPN This pt called and states she wants to cancel and will call back when she is ready to reschedule.  I have canceled it.  Thanks,

## 2022-02-13 ENCOUNTER — Encounter (HOSPITAL_COMMUNITY): Payer: 59

## 2022-02-17 ENCOUNTER — Encounter (HOSPITAL_COMMUNITY): Payer: Self-pay

## 2022-02-17 ENCOUNTER — Ambulatory Visit (HOSPITAL_COMMUNITY): Admit: 2022-02-17 | Payer: 59

## 2022-02-17 SURGERY — COLONOSCOPY WITH PROPOFOL
Anesthesia: Monitor Anesthesia Care

## 2022-03-26 DIAGNOSIS — Z7984 Long term (current) use of oral hypoglycemic drugs: Secondary | ICD-10-CM | POA: Diagnosis not present

## 2022-03-26 DIAGNOSIS — F319 Bipolar disorder, unspecified: Secondary | ICD-10-CM | POA: Diagnosis not present

## 2022-03-26 DIAGNOSIS — E1140 Type 2 diabetes mellitus with diabetic neuropathy, unspecified: Secondary | ICD-10-CM | POA: Diagnosis not present

## 2022-03-26 DIAGNOSIS — F4310 Post-traumatic stress disorder, unspecified: Secondary | ICD-10-CM | POA: Diagnosis not present

## 2022-03-26 DIAGNOSIS — Z79899 Other long term (current) drug therapy: Secondary | ICD-10-CM | POA: Diagnosis not present

## 2022-03-26 DIAGNOSIS — Z794 Long term (current) use of insulin: Secondary | ICD-10-CM | POA: Diagnosis not present

## 2022-03-26 DIAGNOSIS — H9202 Otalgia, left ear: Secondary | ICD-10-CM | POA: Diagnosis not present

## 2022-03-26 DIAGNOSIS — G43009 Migraine without aura, not intractable, without status migrainosus: Secondary | ICD-10-CM | POA: Diagnosis not present

## 2022-03-26 DIAGNOSIS — R42 Dizziness and giddiness: Secondary | ICD-10-CM | POA: Diagnosis not present

## 2022-04-03 DIAGNOSIS — M5432 Sciatica, left side: Secondary | ICD-10-CM | POA: Diagnosis not present

## 2022-04-03 DIAGNOSIS — F319 Bipolar disorder, unspecified: Secondary | ICD-10-CM | POA: Diagnosis not present

## 2022-04-03 DIAGNOSIS — M25552 Pain in left hip: Secondary | ICD-10-CM | POA: Diagnosis not present

## 2022-04-03 DIAGNOSIS — Z841 Family history of disorders of kidney and ureter: Secondary | ICD-10-CM | POA: Diagnosis not present

## 2022-04-03 DIAGNOSIS — Z801 Family history of malignant neoplasm of trachea, bronchus and lung: Secondary | ICD-10-CM | POA: Diagnosis not present

## 2022-04-03 DIAGNOSIS — Z833 Family history of diabetes mellitus: Secondary | ICD-10-CM | POA: Diagnosis not present

## 2022-04-03 DIAGNOSIS — M7072 Other bursitis of hip, left hip: Secondary | ICD-10-CM | POA: Diagnosis not present

## 2022-04-12 DIAGNOSIS — R739 Hyperglycemia, unspecified: Secondary | ICD-10-CM | POA: Diagnosis not present

## 2022-04-12 DIAGNOSIS — F319 Bipolar disorder, unspecified: Secondary | ICD-10-CM | POA: Diagnosis not present

## 2022-04-12 DIAGNOSIS — R059 Cough, unspecified: Secondary | ICD-10-CM | POA: Diagnosis not present

## 2022-04-12 DIAGNOSIS — Z794 Long term (current) use of insulin: Secondary | ICD-10-CM | POA: Diagnosis not present

## 2022-04-12 DIAGNOSIS — R079 Chest pain, unspecified: Secondary | ICD-10-CM | POA: Diagnosis not present

## 2022-04-12 DIAGNOSIS — E1165 Type 2 diabetes mellitus with hyperglycemia: Secondary | ICD-10-CM | POA: Diagnosis not present

## 2022-04-12 DIAGNOSIS — Z885 Allergy status to narcotic agent status: Secondary | ICD-10-CM | POA: Diagnosis not present

## 2022-04-12 DIAGNOSIS — R0789 Other chest pain: Secondary | ICD-10-CM | POA: Diagnosis not present

## 2022-04-13 DIAGNOSIS — R079 Chest pain, unspecified: Secondary | ICD-10-CM | POA: Diagnosis not present

## 2022-04-18 ENCOUNTER — Encounter: Payer: Self-pay | Admitting: Podiatry

## 2022-04-22 ENCOUNTER — Ambulatory Visit (INDEPENDENT_AMBULATORY_CARE_PROVIDER_SITE_OTHER): Payer: 59

## 2022-04-22 ENCOUNTER — Ambulatory Visit (INDEPENDENT_AMBULATORY_CARE_PROVIDER_SITE_OTHER): Payer: 59 | Admitting: Podiatry

## 2022-04-22 ENCOUNTER — Ambulatory Visit: Payer: 59 | Admitting: Podiatry

## 2022-04-22 DIAGNOSIS — Q666 Other congenital valgus deformities of feet: Secondary | ICD-10-CM | POA: Diagnosis not present

## 2022-04-22 DIAGNOSIS — M21619 Bunion of unspecified foot: Secondary | ICD-10-CM

## 2022-04-22 DIAGNOSIS — M7752 Other enthesopathy of left foot: Secondary | ICD-10-CM

## 2022-04-22 NOTE — Progress Notes (Unsigned)
Subjective:  Patient ID: Michele Myers, female    DOB: 10-04-1970,  MRN: [id number]  No chief complaint on file.   52 y.o. female presents with the above complaint.  Patient presents with complaint of left fifth metatarsal phalangeal joint pain.  Patient states pain for touch is progressive gotten worse worse with ambulation worse with pressure she is a diabetic.  She wanted to discuss treatment options for this.  She also does not wear any orthotics.  She is a high risk of ulceration given her diabetes.  She would like to discuss treatment options for this.  Pain scale 7 out of 10 dull achy in nature.   Review of Systems: Negative except as noted in the HPI. Denies N/V/F/Ch.  Past Medical History:  Diagnosis Date   Angina at rest    Backache    Bipolar 1 disorder, depressed (HCC)    Diabetes mellitus without complication (HCC)    Diabetes mellitus, type II (HCC)    Dizzy spells    Glucosuria    History of seizures    Hyperlipidemia    Hypertension    Menopausal hot flushes    PTSD (post-traumatic stress disorder)    Tachycardia     Current Outpatient Medications:    atorvastatin (LIPITOR) 20 MG tablet, Take 20 mg by mouth daily., Disp: , Rfl:    Blood Glucose Monitoring Suppl (ACCU-CHEK GUIDE ME) w/Device KIT, 1 Piece by Does not apply route as directed., Disp: 1 kit, Rfl: 0   buPROPion (WELLBUTRIN SR) 150 MG 12 hr tablet, Take 150 mg by mouth 2 (two) times daily., Disp: , Rfl:    Continuous Blood Gluc Receiver (DEXCOM G7 RECEIVER) DEVI, USE FOR CONTINUOUSE BLOOD GLUCOSE MONITORING., Disp: , Rfl:    FLUoxetine (PROZAC) 20 MG capsule, Take 20 mg by mouth daily., Disp: , Rfl:    glipiZIDE (GLUCOTROL XL) 2.5 MG 24 hr tablet, Take 2 tablets (5 mg total) by mouth daily with breakfast., Disp: 180 tablet, Rfl: 0   glucose blood (ACCU-CHEK GUIDE) test strip, USE TO CHECK BOOD GLUCOSE  4 TIMES DAILY, Disp: 150 strip, Rfl: 2   Insulin Glargine (BASAGLAR KWIKPEN) 100 UNIT/ML, Inject 40  Units into the skin at bedtime., Disp: 15 mL, Rfl: 2   Insulin Pen Needle (BD PEN NEEDLE NANO U/F) 32G X 4 MM MISC, 1 each by Does not apply route 4 (four) times daily., Disp: 150 each, Rfl: 1   lisinopril (ZESTRIL) 20 MG tablet, Take 20 mg by mouth daily., Disp: , Rfl:    methocarbamol (ROBAXIN) 500 MG tablet, Take 500 mg by mouth 2 (two) times daily., Disp: , Rfl:    nitroGLYCERIN (NITROSTAT) 0.4 MG SL tablet, Place 0.4 mg under the tongue every 5 (five) minutes as needed for chest pain., Disp: , Rfl:    omeprazole (PRILOSEC) 40 MG capsule, Take 40 mg by mouth every morning., Disp: , Rfl:   Social History   Tobacco Use  Smoking Status Former   Types: Cigarettes   Quit date: 09/10/2020   Years since quitting: 1.6  Smokeless Tobacco Current  Tobacco Comments   vapes    Allergies  Allergen Reactions   Codeine Anxiety    Rapid heart beat   Objective:  There were no vitals filed for this visit. There is no height or weight on file to calculate BMI. Constitutional Well developed. Well nourished.  Vascular Dorsalis pedis pulses palpable bilaterally. Posterior tibial pulses palpable bilaterally. Capillary refill normal to all digits.  No cyanosis or clubbing noted. Pedal hair growth normal.  Neurologic Normal speech. Oriented to person, place, and time. Epicritic sensation to light touch grossly present bilaterally.  Dermatologic Nails well groomed and normal in appearance. No open wounds. No skin lesions.  Orthopedic: Pain on palpation left submetatarsal 5 plantarflexed fifth metatarsal noted.  No open wounds or lesion noted.  No signs of infection noted pes planovalgus deformity noted   Radiographs: None Assessment:   1. Bunion    Plan:  Patient was evaluated and treated and all questions answered.  Left fifth MTP capsulitis -All questions and concerns were discussed with the patient in extensive detail given the amount of pain that she is experiencing she will benefit  from a steroid injection help decrease inflammatory component associate with pain. -A steroid injection was performed at left fifth MTP using 1% plain Lidocaine and 10 mg of Kenalog. This was well tolerated.  Pes planovalgus -I explained to patient the etiology of pes planovalgus and relationship with arch pain and various treatment options were discussed.  Given patient foot structure in the setting of arch.  I believe patient will benefit from custom-made orthotics to help control the hindfoot motion support the arch of the foot and take the stress away from plantar fascial.  Patient agrees with the plan like to proceed with orthotics -Patient was casted for orthotics with submetatarsal 5 offloading   No follow-ups on file.

## 2022-04-28 DIAGNOSIS — G894 Chronic pain syndrome: Secondary | ICD-10-CM | POA: Diagnosis not present

## 2022-04-28 DIAGNOSIS — Z79899 Other long term (current) drug therapy: Secondary | ICD-10-CM | POA: Diagnosis not present

## 2022-04-28 DIAGNOSIS — M542 Cervicalgia: Secondary | ICD-10-CM | POA: Diagnosis not present

## 2022-04-28 DIAGNOSIS — G5603 Carpal tunnel syndrome, bilateral upper limbs: Secondary | ICD-10-CM | POA: Diagnosis not present

## 2022-04-28 DIAGNOSIS — G63 Polyneuropathy in diseases classified elsewhere: Secondary | ICD-10-CM | POA: Diagnosis not present

## 2022-04-28 DIAGNOSIS — M5417 Radiculopathy, lumbosacral region: Secondary | ICD-10-CM | POA: Diagnosis not present

## 2022-04-28 DIAGNOSIS — Z79891 Long term (current) use of opiate analgesic: Secondary | ICD-10-CM | POA: Diagnosis not present

## 2022-04-28 DIAGNOSIS — M549 Dorsalgia, unspecified: Secondary | ICD-10-CM | POA: Diagnosis not present

## 2022-04-30 ENCOUNTER — Telehealth: Payer: Self-pay | Admitting: Podiatry

## 2022-04-30 NOTE — Telephone Encounter (Signed)
Lmom to call back to schedule picking up orthotics    Balance pending insurance  

## 2022-06-09 DIAGNOSIS — M549 Dorsalgia, unspecified: Secondary | ICD-10-CM | POA: Diagnosis not present

## 2022-06-09 DIAGNOSIS — G894 Chronic pain syndrome: Secondary | ICD-10-CM | POA: Diagnosis not present

## 2022-06-09 DIAGNOSIS — G5603 Carpal tunnel syndrome, bilateral upper limbs: Secondary | ICD-10-CM | POA: Diagnosis not present

## 2022-06-17 DIAGNOSIS — F319 Bipolar disorder, unspecified: Secondary | ICD-10-CM | POA: Diagnosis not present

## 2022-06-17 DIAGNOSIS — E785 Hyperlipidemia, unspecified: Secondary | ICD-10-CM | POA: Diagnosis not present

## 2022-06-17 DIAGNOSIS — Z79899 Other long term (current) drug therapy: Secondary | ICD-10-CM | POA: Diagnosis not present

## 2022-06-17 DIAGNOSIS — Z713 Dietary counseling and surveillance: Secondary | ICD-10-CM | POA: Diagnosis not present

## 2022-06-17 DIAGNOSIS — E1165 Type 2 diabetes mellitus with hyperglycemia: Secondary | ICD-10-CM | POA: Diagnosis not present

## 2022-06-17 DIAGNOSIS — I10 Essential (primary) hypertension: Secondary | ICD-10-CM | POA: Diagnosis not present

## 2022-06-17 DIAGNOSIS — E119 Type 2 diabetes mellitus without complications: Secondary | ICD-10-CM | POA: Diagnosis not present

## 2022-06-18 ENCOUNTER — Other Ambulatory Visit (HOSPITAL_COMMUNITY): Payer: Self-pay | Admitting: Family Medicine

## 2022-06-18 DIAGNOSIS — Z1231 Encounter for screening mammogram for malignant neoplasm of breast: Secondary | ICD-10-CM

## 2022-06-19 DIAGNOSIS — G894 Chronic pain syndrome: Secondary | ICD-10-CM | POA: Diagnosis not present

## 2022-06-19 DIAGNOSIS — M549 Dorsalgia, unspecified: Secondary | ICD-10-CM | POA: Diagnosis not present

## 2022-06-19 DIAGNOSIS — G5603 Carpal tunnel syndrome, bilateral upper limbs: Secondary | ICD-10-CM | POA: Diagnosis not present

## 2022-06-22 DIAGNOSIS — Z720 Tobacco use: Secondary | ICD-10-CM | POA: Diagnosis not present

## 2022-06-22 DIAGNOSIS — H6691 Otitis media, unspecified, right ear: Secondary | ICD-10-CM | POA: Diagnosis not present

## 2022-06-23 ENCOUNTER — Other Ambulatory Visit (HOSPITAL_COMMUNITY): Payer: Self-pay | Admitting: Family Medicine

## 2022-06-23 DIAGNOSIS — R946 Abnormal results of thyroid function studies: Secondary | ICD-10-CM

## 2022-06-25 ENCOUNTER — Ambulatory Visit (HOSPITAL_COMMUNITY)
Admission: RE | Admit: 2022-06-25 | Discharge: 2022-06-25 | Disposition: A | Discharge status: Home / Self Care | Payer: 59 | Source: Ambulatory Visit | Attending: Family Medicine | Admitting: Family Medicine

## 2022-06-25 DIAGNOSIS — R946 Abnormal results of thyroid function studies: Secondary | ICD-10-CM | POA: Diagnosis not present

## 2022-06-25 DIAGNOSIS — E042 Nontoxic multinodular goiter: Secondary | ICD-10-CM | POA: Diagnosis not present

## 2022-06-25 DIAGNOSIS — Z1231 Encounter for screening mammogram for malignant neoplasm of breast: Secondary | ICD-10-CM | POA: Insufficient documentation

## 2022-06-26 ENCOUNTER — Telehealth: Payer: Self-pay | Admitting: Podiatry

## 2022-06-26 NOTE — Telephone Encounter (Signed)
Lvm to schedule appt to p/u orthotics. ?

## 2022-07-01 ENCOUNTER — Other Ambulatory Visit (HOSPITAL_COMMUNITY): Payer: Self-pay | Admitting: Family Medicine

## 2022-07-01 DIAGNOSIS — E041 Nontoxic single thyroid nodule: Secondary | ICD-10-CM

## 2022-07-08 ENCOUNTER — Other Ambulatory Visit (HOSPITAL_COMMUNITY): Payer: Self-pay | Admitting: Family Medicine

## 2022-07-08 DIAGNOSIS — E041 Nontoxic single thyroid nodule: Secondary | ICD-10-CM

## 2022-07-09 ENCOUNTER — Encounter (HOSPITAL_COMMUNITY): Payer: Self-pay

## 2022-07-09 ENCOUNTER — Ambulatory Visit (HOSPITAL_COMMUNITY)
Admission: RE | Admit: 2022-07-09 | Discharge: 2022-07-09 | Disposition: A | Discharge status: Home / Self Care | Payer: 59 | Source: Ambulatory Visit | Attending: Family Medicine | Admitting: Family Medicine

## 2022-07-09 DIAGNOSIS — E041 Nontoxic single thyroid nodule: Secondary | ICD-10-CM

## 2022-07-09 DIAGNOSIS — E042 Nontoxic multinodular goiter: Secondary | ICD-10-CM | POA: Diagnosis not present

## 2022-07-09 MED ORDER — LIDOCAINE HCL (PF) 2 % IJ SOLN
INTRAMUSCULAR | Status: AC
  Filled 2022-07-09: qty 20

## 2022-07-09 MED ORDER — LIDOCAINE HCL (PF) 2 % IJ SOLN
10.0000 mL | Freq: Once | INTRAMUSCULAR | Status: AC
  Administered 2022-07-09: 10 mL

## 2022-07-09 NOTE — Progress Notes (Signed)
PT tolerated thyroid biopsy procedure well today. Labs and afirma x2 obtained and sent for pathology by [facility] from ultrasound. PT ambulatory at discharge with no acute distress noted and verbalized understanding of discharge instructions.

## 2022-07-14 LAB — CYTOLOGY - NON PAP

## 2022-07-15 DIAGNOSIS — F411 Generalized anxiety disorder: Secondary | ICD-10-CM | POA: Diagnosis not present

## 2022-07-15 DIAGNOSIS — Z1211 Encounter for screening for malignant neoplasm of colon: Secondary | ICD-10-CM | POA: Diagnosis not present

## 2022-07-15 DIAGNOSIS — F321 Major depressive disorder, single episode, moderate: Secondary | ICD-10-CM | POA: Diagnosis not present

## 2022-07-15 DIAGNOSIS — F4310 Post-traumatic stress disorder, unspecified: Secondary | ICD-10-CM | POA: Diagnosis not present

## 2022-07-19 DIAGNOSIS — G894 Chronic pain syndrome: Secondary | ICD-10-CM | POA: Diagnosis not present

## 2022-07-19 DIAGNOSIS — G5603 Carpal tunnel syndrome, bilateral upper limbs: Secondary | ICD-10-CM | POA: Diagnosis not present

## 2022-07-19 DIAGNOSIS — M549 Dorsalgia, unspecified: Secondary | ICD-10-CM | POA: Diagnosis not present

## 2022-07-29 DIAGNOSIS — E041 Nontoxic single thyroid nodule: Secondary | ICD-10-CM | POA: Diagnosis not present

## 2022-07-29 DIAGNOSIS — H9202 Otalgia, left ear: Secondary | ICD-10-CM | POA: Diagnosis not present

## 2022-07-29 DIAGNOSIS — E119 Type 2 diabetes mellitus without complications: Secondary | ICD-10-CM | POA: Diagnosis not present

## 2022-07-29 DIAGNOSIS — Z713 Dietary counseling and surveillance: Secondary | ICD-10-CM | POA: Diagnosis not present

## 2022-08-05 DIAGNOSIS — F411 Generalized anxiety disorder: Secondary | ICD-10-CM | POA: Diagnosis not present

## 2022-08-05 DIAGNOSIS — F321 Major depressive disorder, single episode, moderate: Secondary | ICD-10-CM | POA: Diagnosis not present

## 2022-08-19 DIAGNOSIS — G894 Chronic pain syndrome: Secondary | ICD-10-CM | POA: Diagnosis not present

## 2022-08-19 DIAGNOSIS — M549 Dorsalgia, unspecified: Secondary | ICD-10-CM | POA: Diagnosis not present

## 2022-08-19 DIAGNOSIS — G5603 Carpal tunnel syndrome, bilateral upper limbs: Secondary | ICD-10-CM | POA: Diagnosis not present

## 2022-09-19 DIAGNOSIS — G894 Chronic pain syndrome: Secondary | ICD-10-CM | POA: Diagnosis not present

## 2022-09-19 DIAGNOSIS — M549 Dorsalgia, unspecified: Secondary | ICD-10-CM | POA: Diagnosis not present

## 2022-09-19 DIAGNOSIS — G5603 Carpal tunnel syndrome, bilateral upper limbs: Secondary | ICD-10-CM | POA: Diagnosis not present

## 2022-10-16 DIAGNOSIS — F17210 Nicotine dependence, cigarettes, uncomplicated: Secondary | ICD-10-CM | POA: Diagnosis not present

## 2022-10-16 DIAGNOSIS — Z79899 Other long term (current) drug therapy: Secondary | ICD-10-CM | POA: Diagnosis not present

## 2022-10-16 DIAGNOSIS — E1140 Type 2 diabetes mellitus with diabetic neuropathy, unspecified: Secondary | ICD-10-CM | POA: Diagnosis not present

## 2022-10-16 DIAGNOSIS — F4310 Post-traumatic stress disorder, unspecified: Secondary | ICD-10-CM | POA: Diagnosis not present

## 2022-10-16 DIAGNOSIS — R519 Headache, unspecified: Secondary | ICD-10-CM | POA: Diagnosis not present

## 2022-10-16 DIAGNOSIS — Z7984 Long term (current) use of oral hypoglycemic drugs: Secondary | ICD-10-CM | POA: Diagnosis not present

## 2023-03-02 ENCOUNTER — Other Ambulatory Visit: Payer: Self-pay | Admitting: "Endocrinology

## 2023-03-03 ENCOUNTER — Encounter (INDEPENDENT_AMBULATORY_CARE_PROVIDER_SITE_OTHER): Payer: Self-pay | Admitting: *Deleted

## 2023-03-03 ENCOUNTER — Other Ambulatory Visit: Payer: Self-pay | Admitting: Internal Medicine

## 2023-03-03 DIAGNOSIS — Z1231 Encounter for screening mammogram for malignant neoplasm of breast: Secondary | ICD-10-CM

## 2023-03-10 ENCOUNTER — Inpatient Hospital Stay: Admission: RE | Admit: 2023-03-10 | Payer: 59 | Source: Ambulatory Visit

## 2023-04-06 ENCOUNTER — Telehealth: Payer: Self-pay | Admitting: "Endocrinology

## 2023-04-06 NOTE — Telephone Encounter (Signed)
 I called patient & left a detailed VM that Dr Sherril Croon has reached out and he wants her to get back into see Dr Fransico Him for uncontrolled DM. All this information is under media. Please add to appt note when scheduling so Dr Fransico Him knows where it is

## 2023-04-08 ENCOUNTER — Encounter: Payer: Self-pay | Admitting: "Endocrinology

## 2023-04-08 ENCOUNTER — Ambulatory Visit (INDEPENDENT_AMBULATORY_CARE_PROVIDER_SITE_OTHER): Admitting: "Endocrinology

## 2023-04-08 VITALS — BP 126/74 | HR 72 | Ht 59.0 in | Wt 149.0 lb

## 2023-04-08 DIAGNOSIS — I10 Essential (primary) hypertension: Secondary | ICD-10-CM | POA: Diagnosis not present

## 2023-04-08 DIAGNOSIS — E1165 Type 2 diabetes mellitus with hyperglycemia: Secondary | ICD-10-CM

## 2023-04-08 DIAGNOSIS — E782 Mixed hyperlipidemia: Secondary | ICD-10-CM

## 2023-04-08 DIAGNOSIS — Z794 Long term (current) use of insulin: Secondary | ICD-10-CM | POA: Diagnosis not present

## 2023-04-08 NOTE — Patient Instructions (Signed)

## 2023-04-08 NOTE — Progress Notes (Signed)
 04/08/2023, 11:42 AM  Endocrinology follow-up note   Subjective:    Patient ID: Michele Myers, female    DOB: 1970/11/03.  Michele Myers is being seen in follow-up after she was seen in consultation for management of currently uncontrolled symptomatic diabetes requested by  Ignatius Specking, MD.   Past Medical History:  Diagnosis Date   Angina at rest [facility])    Backache    Bipolar 1 disorder, depressed (HCC)    Diabetes mellitus without complication (HCC)    Diabetes mellitus, type II (HCC)    Dizzy spells    Glucosuria    History of seizures    Hyperlipidemia    Hypertension    Menopausal hot flushes    PTSD (post-traumatic stress disorder)    Tachycardia     Past Surgical History:  Procedure Laterality Date   ABDOMINAL HYSTERECTOMY     APPENDECTOMY     CARPAL TUNNEL RELEASE     CESAREAN SECTION     3   CHOLECYSTECTOMY  01/2016   ROTATOR CUFF REPAIR      Social History   Socioeconomic History   Marital status: Single    Spouse name: Not on file   Number of children: Not on file   Years of education: Not on file   Highest education level: Not on file  Occupational History   Not on file  Tobacco Use   Smoking status: Former    Current packs/day: 0.00    Types: Cigarettes    Quit date: 09/10/2020    Years since quitting: 2.5   Smokeless tobacco: Current   Tobacco comments:    vapes  Vaping Use   Vaping status: Every Day  Substance and Sexual Activity   Alcohol use: Not Currently    Comment: history of etoh abuse for 9 years, clean since 1998   Drug use: Never   Sexual activity: Not Currently  Other Topics Concern   Not on file  Social History Narrative   Not on file   Social Drivers of Health   Financial Resource Strain: Low Risk  (01/27/2023)   Received from [facility]   Overall Financial Resource Strain (CARDIA)    Difficulty of Paying Living Expenses: Not hard at  all  Food Insecurity: No Food Insecurity (01/27/2023)   Received from [facility]   Hunger Vital Sign    Worried About Running Out of Food in the Last Year: Never true    Ran Out of Food in the Last Year: Never true  Transportation Needs: No Transportation Needs (01/27/2023)   Received from [facility] - Transportation    Lack of Transportation (Medical): No    Lack of Transportation (Non-Medical): No  Physical Activity: Sufficiently Active (01/27/2023)   Received from [facility]   Exercise Vital Sign    Days of Exercise per Week: 7 days    Minutes of Exercise per Session: 30 min  Stress: Stress Concern Present (01/27/2023)   Received from [facility] - Occupational Stress Questionnaire    Feeling of Stress : To  some extent  Social Connections: Moderately Isolated (01/27/2023)   Received from [facility]   Social Connection and Isolation Panel [NHANES]    Frequency of Communication with Friends and Family: More than three times a week    Frequency of Social Gatherings with Friends and Family: Once a week    Attends Religious Services: Never    Database administrator or Organizations: No    Attends Engineer, structural: Never    Marital Status: Living with partner    Family History  Problem Relation Age of Onset   Cancer Mother        thyroid   Kidney disease Mother    Thyroid disease Mother    Diabetes Mother    Hyperlipidemia Mother    Osteoporosis Mother    Cancer Father        prostate, small cell lung cancer   Hyperlipidemia Father    Hypertension Father    Heart attack Father    Breast cancer Sister    Ovarian cancer Sister    Throat cancer Sister    Heart failure Brother    Diabetes Brother    Kidney disease Brother    Pancreatic cancer Maternal Grandmother    Throat cancer Maternal Grandmother    Breast cancer Maternal Aunt    Cancer Maternal Uncle    Colon cancer Paternal Uncle         possible   Inflammatory bowel disease Neg Hx    Crohn's disease Neg Hx    Celiac disease Neg Hx     Outpatient Encounter Medications as of 04/08/2023  Medication Sig   alendronate (FOSAMAX) 70 MG tablet Take 70 mg by mouth once a week.   atorvastatin (LIPITOR) 80 MG tablet Take 80 mg by mouth daily.   ibuprofen (ADVIL) 800 MG tablet 800 mg every 8 (eight) hours as needed.   insulin glargine (LANTUS) 100 UNIT/ML Solostar Pen Inject 40 Units into the skin at bedtime.   meloxicam (MOBIC) 7.5 MG tablet Take 7.5 mg by mouth daily.   NOVOLOG FLEXPEN 100 UNIT/ML FlexPen Inject 10-13 Units into the skin 3 (three) times daily before meals.   BD PEN NEEDLE NANO 2ND GEN 32G X 4 MM MISC USE WITH INSULIN AS DIRECTED   Blood Glucose Monitoring Suppl (ACCU-CHEK GUIDE ME) w/Device KIT 1 Piece by Does not apply route as directed.   Continuous Blood Gluc Receiver (DEXCOM G7 RECEIVER) DEVI USE FOR CONTINUOUSE BLOOD GLUCOSE MONITORING.   FLUoxetine (PROZAC) 40 MG capsule Take 40 mg by mouth daily.   glucose blood (ACCU-CHEK GUIDE) test strip USE TO CHECK BOOD GLUCOSE  4 TIMES DAILY   lisinopril (ZESTRIL) 20 MG tablet Take 20 mg by mouth daily.   methocarbamol (ROBAXIN) 500 MG tablet Take 500 mg by mouth 2 (two) times daily.   nitroGLYCERIN (NITROSTAT) 0.4 MG SL tablet Place 0.4 mg under the tongue every 5 (five) minutes as needed for chest pain.   omeprazole (PRILOSEC) 40 MG capsule Take 40 mg by mouth every morning.   [DISCONTINUED] atorvastatin (LIPITOR) 20 MG tablet Take 20 mg by mouth daily.   [DISCONTINUED] buPROPion (WELLBUTRIN SR) 150 MG 12 hr tablet Take 150 mg by mouth 2 (two) times daily.   [DISCONTINUED] FLUoxetine (PROZAC) 20 MG capsule Take 20 mg by mouth daily.   [DISCONTINUED] glipiZIDE (GLUCOTROL XL) 10 MG 24 hr tablet Take 10 mg by mouth daily.   [DISCONTINUED] glipiZIDE (GLUCOTROL XL) 2.5 MG 24 hr tablet Take 2  tablets (5 mg total) by mouth daily with breakfast.   [DISCONTINUED]  Insulin Glargine (BASAGLAR KWIKPEN) 100 UNIT/ML Inject 40 Units into the skin at bedtime.   No facility-administered encounter medications on file as of 04/08/2023.    ALLERGIES: Allergies  Allergen Reactions   Codeine Anxiety    Rapid heart beat    VACCINATION STATUS:  There is no immunization history on file for this patient.  Diabetes She presents for her follow-up diabetic visit. She has type 2 diabetes mellitus. Onset time: she was diagnosed at approximate age of 38 yrs. Her disease course has been worsening. There are no hypoglycemic associated symptoms. Pertinent negatives for hypoglycemia include no headaches, seizures or tremors. Pertinent negatives for diabetes include no blurred vision, no chest pain, no polydipsia and no polyuria. There are no hypoglycemic complications. Symptoms are worsening. There are no diabetic complications. Risk factors for coronary artery disease include dyslipidemia, diabetes mellitus, family history, sedentary lifestyle and tobacco exposure. Current diabetic treatment includes oral agent (monotherapy). Her weight is increasing steadily. She is following a generally unhealthy diet. When asked about meal planning, she reported none. She has not had a previous visit with a dietitian. She never participates in exercise. Her home blood glucose trend is increasing steadily. Her breakfast blood glucose range is generally 180-200 mg/dl. Her lunch blood glucose range is generally 180-200 mg/dl. Her dinner blood glucose range is generally 180-200 mg/dl. Her bedtime blood glucose range is generally 180-200 mg/dl. Her overall blood glucose range is 180-200 mg/dl. (She missed her appointment since October 2023.  She is returning back to 2 significant loss of control with recent A1c of 14%.  She was recently given NovoLog 10 units 3 times daily AC and her basal insulin was switched to Lantus currently 45 units daily.  Her CGM shows average blood glucose of 180 mg per DL for 14  days.  Her AGP report shows 55% time range, 31% Level One hyperglycemia, 13% level 2 hyperglycemia.  She has no hypoglycemia.    ) An ACE inhibitor/angiotensin II receptor blocker is being taken. Eye exam is current.  Hyperlipidemia This is a chronic problem. The current episode started more than 1 year ago. The problem is uncontrolled. Pertinent negatives include no chest pain, myalgias or shortness of breath. Current antihyperlipidemic treatment includes statins. Risk factors for coronary artery disease include diabetes mellitus, dyslipidemia, family history, hypertension and a sedentary lifestyle.  Hypertension This is a chronic problem. The current episode started more than 1 year ago. Pertinent negatives include no blurred vision, chest pain, headaches, palpitations or shortness of breath. Risk factors for coronary artery disease include diabetes mellitus, dyslipidemia, sedentary lifestyle and smoking/tobacco exposure. Past treatments include ACE inhibitors.     Review of Systems  Constitutional:  Negative for chills and fever.  Eyes:  Negative for blurred vision.  Respiratory:  Negative for cough and shortness of breath.   Cardiovascular:  Negative for chest pain and palpitations.       No Shortness of breath  Gastrointestinal:  Negative for abdominal pain, diarrhea, nausea and vomiting.  Endocrine: Negative for polydipsia and polyuria.  Genitourinary:  Negative for frequency, hematuria and urgency.  Musculoskeletal:  Negative for myalgias.  Skin:  Negative for rash.  Neurological:  Negative for tremors, seizures and headaches.  Hematological:  Does not bruise/bleed easily.  Psychiatric/Behavioral:  Negative for hallucinations and suicidal ideas.     Objective:       04/08/2023    8:53 AM 07/09/2022  3:15 PM 02/03/2022    1:24 PM  Vitals with BMI  Height 4\' 11"   4\' 11"   Weight 149 lbs  148 lbs 3 oz  BMI 30.08  29.92  Systolic 126 127 962  Diastolic 74 79 81  Pulse 72 80 79     BP 126/74   Pulse 72   Ht 4\' 11"  (1.499 m)   Wt 149 lb (67.6 kg)   BMI 30.09 kg/m   Wt Readings from Last 3 Encounters:  04/08/23 149 lb (67.6 kg)  02/03/22 148 lb 3.2 oz (67.2 kg)  10/07/21 128 lb 12.8 oz (58.4 kg)     CMP ( most recent) CMP     Component Value Date/Time   NA 129 (A) 07/01/2021 0000   K 5.4 (A) 07/01/2021 0000   CL 93 (A) 07/01/2021 0000   CO2 23 (A) 07/01/2021 0000   GLUCOSE 311 (H) 12/18/2019 0858   BUN 25 (A) 07/01/2021 0000   CREATININE 1.1 07/01/2021 0000   CREATININE 0.50 12/18/2019 0858   CALCIUM 10.2 07/01/2021 0000   PROT 6.8 12/18/2019 0849   ALBUMIN 4.2 07/01/2021 0000   AST 21 12/18/2019 0849   ALT 11 07/01/2021 0000   ALKPHOS 98 07/01/2021 0000   BILITOT 0.7 12/18/2019 0849   GFRNONAA >60 12/18/2019 0849     Diabetic Labs (most recent): Lab Results  Component Value Date   HGBA1C 14.5 (A) 09/24/2021   HGBA1C >14 07/01/2021   HGBA1C 8.5 (A) 03/12/2021   MICROALBUR <0.2 10/08/2020     Lipid Panel ( most recent) Lipid Panel     Component Value Date/Time   CHOL 320 (A) 07/01/2021 0000   TRIG 532 (A) 07/01/2021 0000   HDL 50 07/01/2021 0000   LDLCALC 189 07/01/2021 0000      Lab Results  Component Value Date   TSH 0.20 (A) 07/01/2021   TSH 0.06 (A) 10/08/2020     Assessment & Plan:   1. Type 2 diabetes mellitus with hyperglycemia, without long-term current use of insulin (HCC)   - Haydyn Liddell Schneider has currently uncontrolled symptomatic type 2 DM since  53 years of age.  She missed her appointment since October 2023.  She is returning back to 2 significant loss of control with recent A1c of 14%.  She was recently given NovoLog 10 units 3 times daily AC and her basal insulin was switched to Lantus currently 45 units daily.  Her CGM shows average blood glucose of 180 mg per DL for 14 days.  Her AGP report shows 55% time range, 31% Level One hyperglycemia, 13% level 2 hyperglycemia.  She has no hypoglycemia.    Recent  labs reviewed. - I had a long discussion with her about the progressive nature of diabetes and the pathology behind its complications. 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 These are all discussed in detail with her.  - I have counseled her on diet  and weight management  by adopting a carbohydrate restricted/protein rich diet. Patient is encouraged to switch to  unprocessed or minimally processed     complex starch and increased protein intake (animal or plant source), fruits, and vegetables. -  she is advised to stick to a routine mealtimes to eat 3 meals  a day and avoid unnecessary snacks ( to snack only to correct hypoglycemia).  -  she acknowledges that there is a room for improvement in her food and drink choices. - Suggestion is made for her to avoid simple carbohydrates  from her diet including Cakes, Sweet Desserts, Ice Cream, Soda (diet and regular), Sweet Tea, Candies, Chips, Cookies, Store Bought Juices, Alcohol in Excess of  1-2 drinks a day, Artificial Sweeteners,  Coffee Creamer, and "Sugar-free" Products, Lemonade. This will help patient to have more stable blood glucose profile and potentially avoid unintended weight gain.   - she will be scheduled with Norm Salt, RDN, CDE for diabetes education.  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    She did not control glycemia with only basal insulin.  She will continue to need intensive treatment with basal/bolus insulin in order for her to achieve and maintain control of diabetes to target. - Accordingly, I advised her to consolidate her Lantus to 40 units nightly,  readjust her NovoLog to 10-13 units 3 times daily AC if Premeal blood glucose readings are above 90 mg per DL  Patient specific correction dose was given to her in writing.  She is not a suitable candidate for metformin or GLP-1 receptor agonist.  -At this point, glipizide will only complicate her care, she is advised to discontinue.  - she is encouraged to call clinic for blood glucose levels less than 70 or above 200 mg /dl.    - Specific targets for  A1c;  LDL, HDL,  and Triglycerides were discussed with the patient.  2) Blood Pressure /Hypertension:  Her blood pressure is controlled to target.  she is advised to continue her current medications including lisinopril 5 mg p.o. daily with breakfast . 3) Lipids/Hyperlipidemia:   Review of her recent lipid panel showed uncontrolled  LDL at 151.  she  is advised to continue atorvastatin 20 mg p.o. daily at bedtime.     Side effects and precautions discussed with her.  4)  Weight/Diet:  Body mass index is 30.09 kg/m.  -   She has gained significant amount of weight since last visit.  Further weight gain will further complicate her diabetes care.  See above on macronutrients recommendations.   5) Chronic Care/Health Maintenance:  -she  is on ACEI/ARB and Statin medications and  is encouraged to initiate and continue to follow up with Ophthalmology, Dentist,  Podiatrist at least yearly or according to recommendations, and advised to  quit smoking. I have recommended yearly flu vaccine and pneumonia vaccine at least every 5 years; moderate intensity exercise for up to 150 minutes weekly; and  sleep for 7- 9 hours a day.    - she is  advised to maintain close follow up with Ignatius Specking, MD for primary care needs, as well as her other providers for optimal and coordinated care.   I spent  26  minutes in the care of the patient today including review of labs from CMP, Lipids, Thyroid Function, Hematology (current and previous including  abstractions from other facilities); face-to-face time discussing  her blood glucose readings/logs, discussing hypoglycemia and hyperglycemia episodes and symptoms, medications doses, her options of short and long term treatment based on the latest standards of care / guidelines;  discussion about incorporating lifestyle medicine;  and documenting the encounter. Risk reduction counseling performed per USPSTF guidelines to reduce  obesity and cardiovascular risk factors.  Please refer to Patient Instructions for Blood Glucose Monitoring and Insulin/Medications Dosing Guide"  in media tab for additional information. Please  also refer to " Patient Self Inventory" in the Media  tab for reviewed elements of pertinent patient history.  Michele Client Delio participated in the discussions, expressed understanding, and voiced agreement with the above plans.  All questions were answered to her satisfaction. she is encouraged to contact clinic should she have any questions or concerns prior to her return visit.   Follow up plan: - Return in about 2 weeks (around 04/22/2023) for Bring Meter/CGM Device/Logs- A1c in Office.  Marquis Lunch, MD [facility] [address] [place], [place] [address] Phone: [phone]  Fax: [phone]    04/08/2023, 11:42 AM  This note was partially dictated with voice recognition software. Similar sounding words can be transcribed inadequately or may not  be corrected upon review.

## 2023-04-23 ENCOUNTER — Ambulatory Visit: Admitting: "Endocrinology

## 2023-05-14 ENCOUNTER — Ambulatory Visit (INDEPENDENT_AMBULATORY_CARE_PROVIDER_SITE_OTHER): Admitting: "Endocrinology

## 2023-05-14 ENCOUNTER — Encounter: Payer: Self-pay | Admitting: "Endocrinology

## 2023-05-14 VITALS — BP 128/84 | HR 72 | Ht 59.0 in | Wt 150.6 lb

## 2023-05-14 DIAGNOSIS — I10 Essential (primary) hypertension: Secondary | ICD-10-CM | POA: Diagnosis not present

## 2023-05-14 DIAGNOSIS — Z794 Long term (current) use of insulin: Secondary | ICD-10-CM

## 2023-05-14 DIAGNOSIS — E782 Mixed hyperlipidemia: Secondary | ICD-10-CM | POA: Diagnosis not present

## 2023-05-14 DIAGNOSIS — E1165 Type 2 diabetes mellitus with hyperglycemia: Secondary | ICD-10-CM

## 2023-05-14 LAB — POCT GLYCOSYLATED HEMOGLOBIN (HGB A1C): HbA1c, POC (controlled diabetic range): 7.3 % — AB (ref 0.0–7.0)

## 2023-05-14 LAB — POCT UA - MICROALBUMIN
Albumin/Creatinine Ratio, Urine, POC: <30
Creatinine, POC: 100 mg/dL
Microalbumin Ur, POC: 10 mg/L

## 2023-05-14 NOTE — Patient Instructions (Signed)

## 2023-05-14 NOTE — Progress Notes (Signed)
 05/14/2023, 9:11 AM  Endocrinology follow-up note   Subjective:    Patient ID: Michele Myers, female    DOB: [birth year].  Michele Myers is being seen in follow-up after she was seen in consultation for management of currently uncontrolled symptomatic diabetes requested by  Orlena Bitters, MD.   Past Medical History:  Diagnosis Date   Angina at rest [facility])    Backache    Bipolar 1 disorder, depressed (HCC)    Diabetes mellitus without complication (HCC)    Diabetes mellitus, type II (HCC)    Dizzy spells    Glucosuria    History of seizures    Hyperlipidemia    Hypertension    Menopausal hot flushes    PTSD (post-traumatic stress disorder)    Tachycardia     Past Surgical History:  Procedure Laterality Date   ABDOMINAL HYSTERECTOMY     APPENDECTOMY     CARPAL TUNNEL RELEASE     CESAREAN SECTION     3   CHOLECYSTECTOMY  01/2016   ROTATOR CUFF REPAIR      Social History   Socioeconomic History   Marital status: Single    Spouse name: Not on file   Number of children: Not on file   Years of education: Not on file   Highest education level: Not on file  Occupational History   Not on file  Tobacco Use   Smoking status: Former    Current packs/day: 0.00    Types: Cigarettes    Quit date: 09/10/2020    Years since quitting: 2.6   Smokeless tobacco: Current   Tobacco comments:    vapes  Vaping Use   Vaping status: Every Day  Substance and Sexual Activity   Alcohol use: Not Currently    Comment: history of etoh abuse for 9 years, clean since 1998   Drug use: Never   Sexual activity: Not Currently  Other Topics Concern   Not on file  Social History Narrative   Not on file   Social Drivers of Health   Financial Resource Strain: Low Risk  (01/27/2023)   Received from [facility]   Overall Financial Resource Strain (CARDIA)    Difficulty of Paying Living Expenses: Not hard at  all  Food Insecurity: No Food Insecurity (01/27/2023)   Received from [facility]   Hunger Vital Sign    Worried About Running Out of Food in the Last Year: Never true    Ran Out of Food in the Last Year: Never true  Transportation Needs: No Transportation Needs (01/27/2023)   Received from [facility] - Transportation    Lack of Transportation (Medical): No    Lack of Transportation (Non-Medical): No  Physical Activity: Sufficiently Active (01/27/2023)   Received from [facility]   Exercise Vital Sign    Days of Exercise per Week: 7 days    Minutes of Exercise per Session: 30 min  Stress: Stress Concern Present (01/27/2023)   Received from [facility] - Occupational Stress Questionnaire    Feeling of Stress : To  some extent  Social Connections: Moderately Isolated (01/27/2023)   Received from [facility]   Social Connection and Isolation Panel [NHANES]    Frequency of Communication with Friends and Family: More than three times a week    Frequency of Social Gatherings with Friends and Family: Once a week    Attends Religious Services: Never    Database administrator or Organizations: No    Attends Engineer, structural: Never    Marital Status: Living with partner    Family History  Problem Relation Age of Onset   Cancer Mother        thyroid    Kidney disease Mother    Thyroid  disease Mother    Diabetes Mother    Hyperlipidemia Mother    Osteoporosis Mother    Cancer Father        prostate, small cell lung cancer   Hyperlipidemia Father    Hypertension Father    Heart attack Father    Breast cancer Sister    Ovarian cancer Sister    Throat cancer Sister    Heart failure Brother    Diabetes Brother    Kidney disease Brother    Pancreatic cancer Maternal Grandmother    Throat cancer Maternal Grandmother    Breast cancer Maternal Aunt    Cancer Maternal Uncle    Colon cancer Paternal Uncle         possible   Inflammatory bowel disease Neg Hx    Crohn's disease Neg Hx    Celiac disease Neg Hx     Outpatient Encounter Medications as of 05/14/2023  Medication Sig   alendronate (FOSAMAX) 70 MG tablet Take 70 mg by mouth once a week.   atorvastatin (LIPITOR) 80 MG tablet Take 80 mg by mouth daily.   BD PEN NEEDLE NANO 2ND GEN 32G X 4 MM MISC USE WITH INSULIN  AS DIRECTED   Blood Glucose Monitoring Suppl (ACCU-CHEK GUIDE ME) w/Device KIT 1 Piece by Does not apply route as directed.   Continuous Blood Gluc Receiver (DEXCOM G7 RECEIVER) DEVI USE FOR CONTINUOUSE BLOOD GLUCOSE MONITORING.   FLUoxetine (PROZAC) 40 MG capsule Take 40 mg by mouth daily.   glucose blood (ACCU-CHEK GUIDE) test strip USE TO CHECK BOOD GLUCOSE  4 TIMES DAILY   ibuprofen (ADVIL) 800 MG tablet 800 mg every 8 (eight) hours as needed.   insulin  glargine (LANTUS) 100 UNIT/ML Solostar Pen Inject 45 Units into the skin at bedtime.   lisinopril (ZESTRIL) 20 MG tablet Take 20 mg by mouth daily.   meloxicam (MOBIC) 7.5 MG tablet Take 7.5 mg by mouth daily.   methocarbamol (ROBAXIN) 500 MG tablet Take 500 mg by mouth 2 (two) times daily.   nitroGLYCERIN (NITROSTAT) 0.4 MG SL tablet Place 0.4 mg under the tongue every 5 (five) minutes as needed for chest pain.   NOVOLOG FLEXPEN 100 UNIT/ML FlexPen Inject 10-13 Units into the skin 3 (three) times daily before meals.   omeprazole (PRILOSEC) 40 MG capsule Take 40 mg by mouth every morning.   No facility-administered encounter medications on file as of 05/14/2023.    ALLERGIES: Allergies  Allergen Reactions   Codeine Anxiety    Rapid heart beat    VACCINATION STATUS:  There is no immunization history on file for this patient.  Diabetes She presents for her follow-up diabetic visit. She has type 2 diabetes mellitus. Onset time: she was diagnosed at approximate age of 38 yrs. Her disease course has been improving. There  are no hypoglycemic associated symptoms.  Pertinent negatives for hypoglycemia include no headaches, seizures or tremors. Pertinent negatives for diabetes include no blurred vision, no chest pain, no polydipsia and no polyuria. There are no hypoglycemic complications. Symptoms are improving. There are no diabetic complications. Risk factors for coronary artery disease include dyslipidemia, diabetes mellitus, family history, sedentary lifestyle and tobacco exposure. Current diabetic treatment includes oral agent (monotherapy). Her weight is fluctuating minimally. She is following a generally unhealthy diet. When asked about meal planning, she reported none. She has not had a previous visit with a dietitian. She never participates in exercise. Her home blood glucose trend is decreasing steadily. Her breakfast blood glucose range is generally 180-200 mg/dl. Her lunch blood glucose range is generally 180-200 mg/dl. Her dinner blood glucose range is generally 180-200 mg/dl. Her bedtime blood glucose range is generally 180-200 mg/dl. Her overall blood glucose range is 180-200 mg/dl. Adriana Hopping presents with significant improvement in her glycemic profile.  Her CGM overview shows 50% in range, 37% level 1 hyperglycemia, 13% level 2 hyperglycemia.  She has no hypoglycemia.  Her average blood glucose is 187 for the most recent 14 days.  Her point-of-care A1c is 7.3% improving from 14% during her last visit.       ) An ACE inhibitor/angiotensin II receptor blocker is being taken. Eye exam is current.  Hyperlipidemia This is a chronic problem. The current episode started more than 1 year ago. The problem is uncontrolled. Pertinent negatives include no chest pain, myalgias or shortness of breath. Current antihyperlipidemic treatment includes statins. Risk factors for coronary artery disease include diabetes mellitus, dyslipidemia, family history, hypertension and a sedentary lifestyle.  Hypertension This is a chronic problem. The current episode started more than 1  year ago. Pertinent negatives include no blurred vision, chest pain, headaches, palpitations or shortness of breath. Risk factors for coronary artery disease include diabetes mellitus, dyslipidemia, sedentary lifestyle and smoking/tobacco exposure. Past treatments include ACE inhibitors.     Review of Systems  Constitutional:  Negative for chills and fever.  Eyes:  Negative for blurred vision.  Respiratory:  Negative for cough and shortness of breath.   Cardiovascular:  Negative for chest pain and palpitations.       No Shortness of breath  Gastrointestinal:  Negative for abdominal pain, diarrhea, nausea and vomiting.  Endocrine: Negative for polydipsia and polyuria.  Genitourinary:  Negative for frequency, hematuria and urgency.  Musculoskeletal:  Negative for myalgias.  Skin:  Negative for rash.  Neurological:  Negative for tremors, seizures and headaches.  Hematological:  Does not bruise/bleed easily.  Psychiatric/Behavioral:  Negative for hallucinations and suicidal ideas.     Objective:       05/14/2023    8:47 AM 04/08/2023    8:53 AM 07/09/2022    3:15 PM  Vitals with BMI  Height 4\' 11"  4\' 11"    Weight 150 lbs 10 oz 149 lbs   BMI 30.4 30.08   Systolic 128 126 161  Diastolic 84 74 79  Pulse 72 72 80    BP 128/84   Pulse 72   Ht 4\' 11"  (1.499 m)   Wt 150 lb 9.6 oz (68.3 kg)   BMI 30.42 kg/m   Wt Readings from Last 3 Encounters:  05/14/23 150 lb 9.6 oz (68.3 kg)  04/08/23 149 lb (67.6 kg)  02/03/22 148 lb 3.2 oz (67.2 kg)     CMP ( most recent) CMP     Component Value Date/Time   NA  129 (A) 07/01/2021 0000   K 5.4 (A) 07/01/2021 0000   CL 93 (A) 07/01/2021 0000   CO2 23 (A) 07/01/2021 0000   GLUCOSE 311 (H) 12/18/2019 0858   BUN 25 (A) 07/01/2021 0000   CREATININE 1.1 07/01/2021 0000   CREATININE 0.50 12/18/2019 0858   CALCIUM 10.2 07/01/2021 0000   PROT 6.8 12/18/2019 0849   ALBUMIN 4.2 07/01/2021 0000   AST 21 12/18/2019 0849   ALT 11 07/01/2021 0000    ALKPHOS 98 07/01/2021 0000   BILITOT 0.7 12/18/2019 0849   GFRNONAA >60 12/18/2019 0849     Diabetic Labs (most recent): Lab Results  Component Value Date   HGBA1C 7.3 (A) 05/14/2023   HGBA1C 14.5 (A) 09/24/2021   HGBA1C >14 07/01/2021   MICROALBUR 10 05/14/2023   MICROALBUR <0.2 10/08/2020     Lipid Panel ( most recent) Lipid Panel     Component Value Date/Time   CHOL 320 (A) 07/01/2021 0000   TRIG 532 (A) 07/01/2021 0000   HDL 50 07/01/2021 0000   LDLCALC 189 07/01/2021 0000      Lab Results  Component Value Date   TSH 0.20 (A) 07/01/2021   TSH 0.06 (A) 10/08/2020     Assessment & Plan:   1. Type 2 diabetes mellitus with hyperglycemia, without long-term current use of insulin  (HCC)   - Michele Sheen Bark has currently uncontrolled symptomatic type 2 DM since  53 years of age.  Verl presents with significant improvement in her glycemic profile.  Her CGM overview shows 50% in range, 37% level 1 hyperglycemia, 13% level 2 hyperglycemia.  She has no hypoglycemia.  Her average blood glucose is 187 for the most recent 14 days.  Her point-of-care A1c is 7.3% improving from 14% during her last visit.      Recent labs reviewed. - I had a long discussion with her about the progressive nature of diabetes and the pathology behind its complications. 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 These are all discussed in detail with her.  - I have counseled her on diet  and weight management  by adopting a carbohydrate restricted/protein rich diet. Patient is encouraged to switch to  unprocessed or minimally processed     complex starch and increased protein intake (animal or plant source), fruits, and vegetables. -  she is advised to stick to a routine mealtimes to eat 3 meals  a day and avoid unnecessary  snacks ( to snack only to correct hypoglycemia).  - she acknowledges that there is a room for improvement in her food and drink choices. - Suggestion is made for her to avoid simple carbohydrates  from her diet including Cakes, Sweet Desserts, Ice Cream, Soda (diet and regular), Sweet Tea, Candies, Chips, Cookies, Store Bought Juices, Alcohol in Excess of  1-2 drinks a day, Artificial Sweeteners,  Coffee Creamer, and "Sugar-free" Products, Lemonade. This will help patient to have more stable blood glucose profile and potentially avoid unintended weight gain.   - she will be scheduled with Penny Crumpton, RDN, CDE for diabetes education.  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    She presents with better control of glycemia  utilizing basal/bolus insulin .  She is advised to increase her Lantus to 45 units nightly, continue NovoLog 10-13 units 3 times daily AC if Premeal blood glucose readings are above 90 mg per DL  Patient specific correction dose was given to her in writing.  She is not a suitable candidate for metformin  or GLP-1 receptor agonist.  -She is encouraged to utilize her CGM continuously. -- she is encouraged to call clinic for blood glucose levels less than 70 or above 200 mg /dl weekly average.    - Specific targets for  A1c;  LDL, HDL,  and Triglycerides were discussed with the patient.  2) Blood Pressure /Hypertension:  Her blood pressure is controlled to target.  she is advised to continue her current medications including lisinopril 5 mg p.o. daily with breakfast .   3) Lipids/Hyperlipidemia:   Review of her recent lipid panel showed uncontrolled  LDL at 189.  She is advised to continue atorvastatin 20 mg p.o. daily at bedtime.      Side effects and precautions discussed with  her.  4)  Weight/Diet:  Body mass index is 30.42 kg/m.  -Due to her past history of EtOH with risk of exocrine pancreatic insufficiency, major weight loss is not recommended to her.  See above on macronutrients recommendations.   5) Chronic Care/Health Maintenance:  -she  is on ACEI/ARB and Statin medications and  is encouraged to initiate and continue to follow up with Ophthalmology, Dentist,  Podiatrist at least yearly or according to recommendations, and advised to  quit smoking. I have recommended yearly flu vaccine and pneumonia vaccine at least every 5 years; moderate intensity exercise for up to 150 minutes weekly; and  sleep for 7- 9 hours a day.    - she is  advised to maintain close follow up with Orlena Bitters, MD for primary care needs, as well as her other providers for optimal and coordinated care.   I spent  26  minutes in the care of the patient today including review of labs from CMP, Lipids, Thyroid  Function, Hematology (current and previous including abstractions from other facilities); face-to-face time discussing  her blood glucose readings/logs, discussing hypoglycemia and hyperglycemia episodes and symptoms, medications doses, her options of short and long term treatment based on the latest standards of care / guidelines;  discussion about incorporating lifestyle medicine;  and documenting the encounter. Risk reduction counseling performed per USPSTF guidelines to reduce  obesity and cardiovascular risk factors.     Please refer to Patient Instructions for Blood Glucose Monitoring and Insulin /Medications Dosing Guide"  in media tab for additional information. Please  also refer to " Patient Self Inventory" in the Media  tab for reviewed elements of pertinent patient history.  Michele Sheen Duhon participated in the discussions, expressed understanding, and voiced agreement with the above plans.  All questions were answered to her satisfaction. she is encouraged to contact clinic  should she have any questions or concerns prior to her return visit.  Follow up plan: - Return in about 4 months (around 09/14/2023) for F/U with Pre-visit Labs, Meter/CGM/Logs, A1c here.  Kalvin Orf, MD [facility] [address] [place], [place] [address] Phone: [phone]  Fax: [phone]    05/14/2023, 9:11 AM  This note was partially dictated with voice recognition software. Similar sounding words can be transcribed inadequately or may not  be corrected upon review.

## 2023-06-25 IMAGING — MG MM DIGITAL SCREENING BILAT W/ TOMO AND CAD
8 series · 9 of 24 positions shown · non-contrast
Comparison: None.

CLINICAL DATA: Screening.

EXAM:
DIGITAL SCREENING BILATERAL MAMMOGRAM WITH TOMOSYNTHESIS AND CAD
TECHNIQUE: Bilateral screening digital craniocaudal and mediolateral oblique
mammograms were obtained. Bilateral screening digital breast
tomosynthesis was performed. The images were evaluated with
computer-aided detection.

[L CC synth-2D]
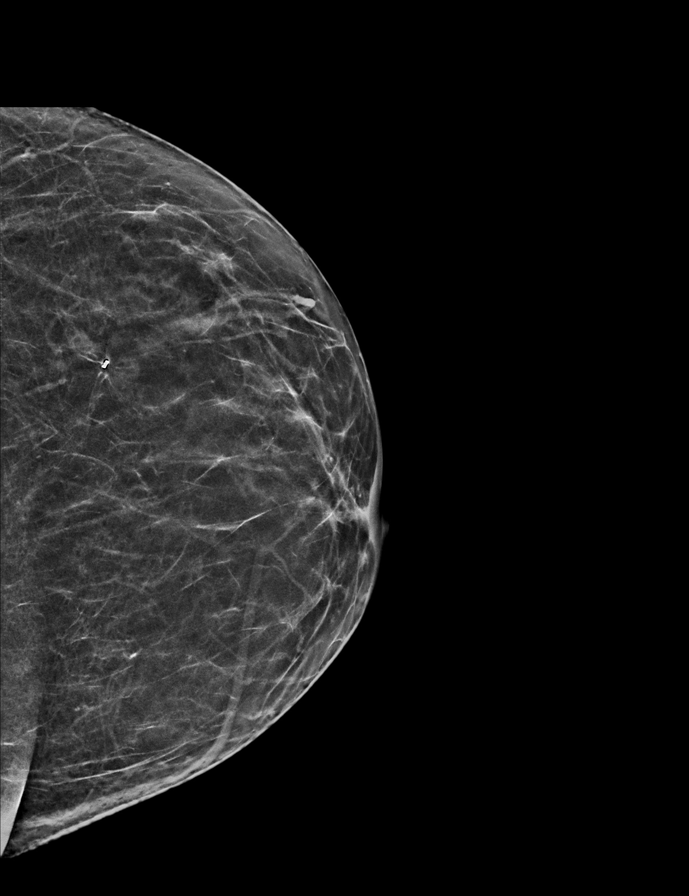

[R MLO synth-2D]
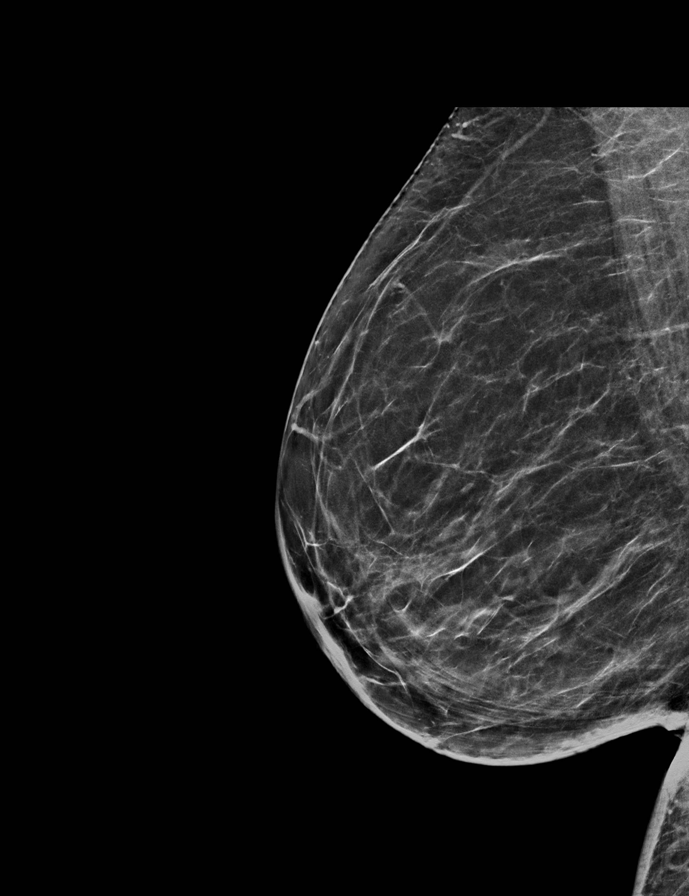

[L MLO synth-2D]
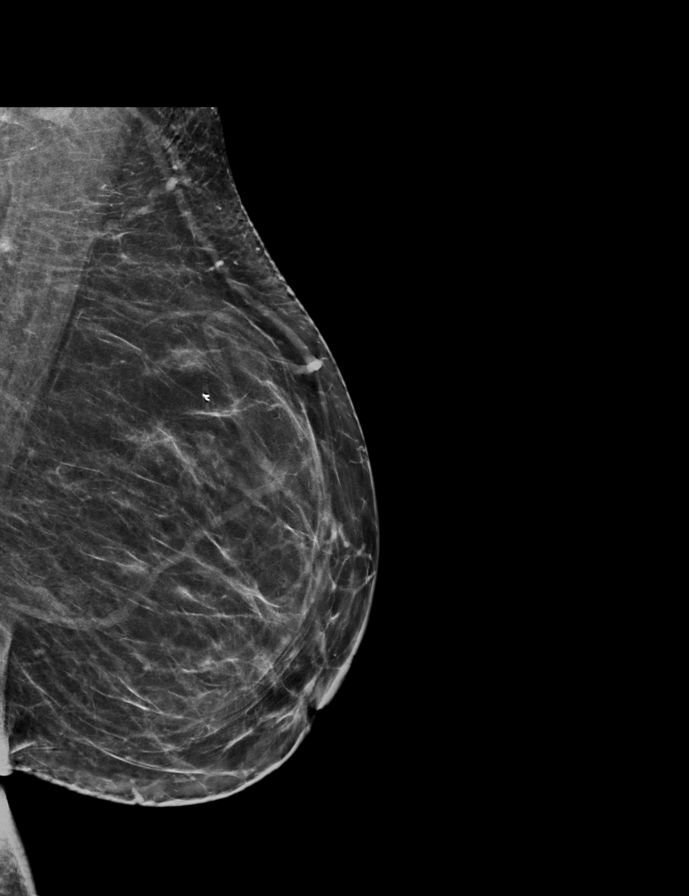

[R CC synth-2D]
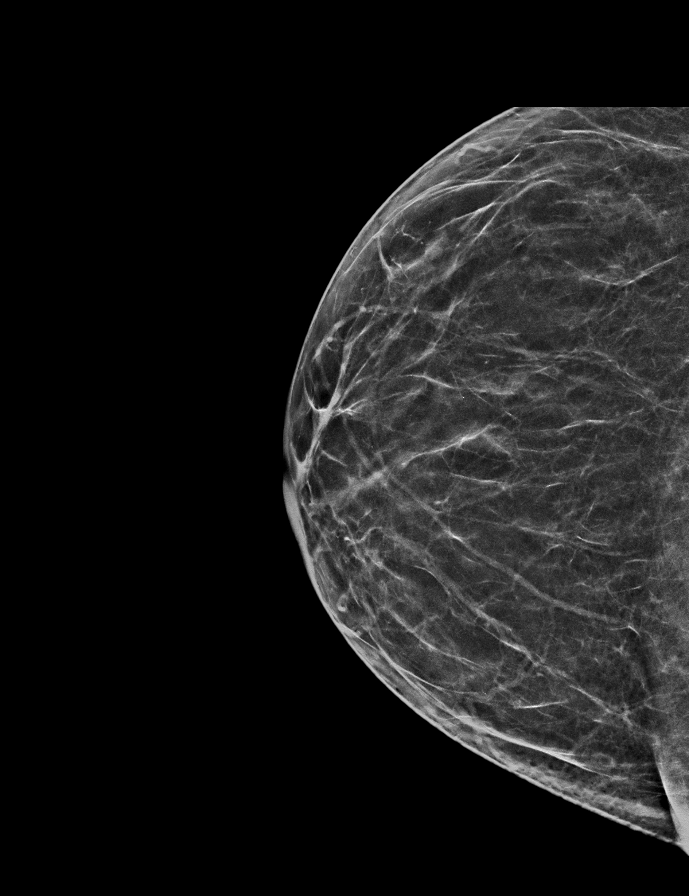

[R CC tomo · 2 of 53 frames shown]
[frame 18/53]
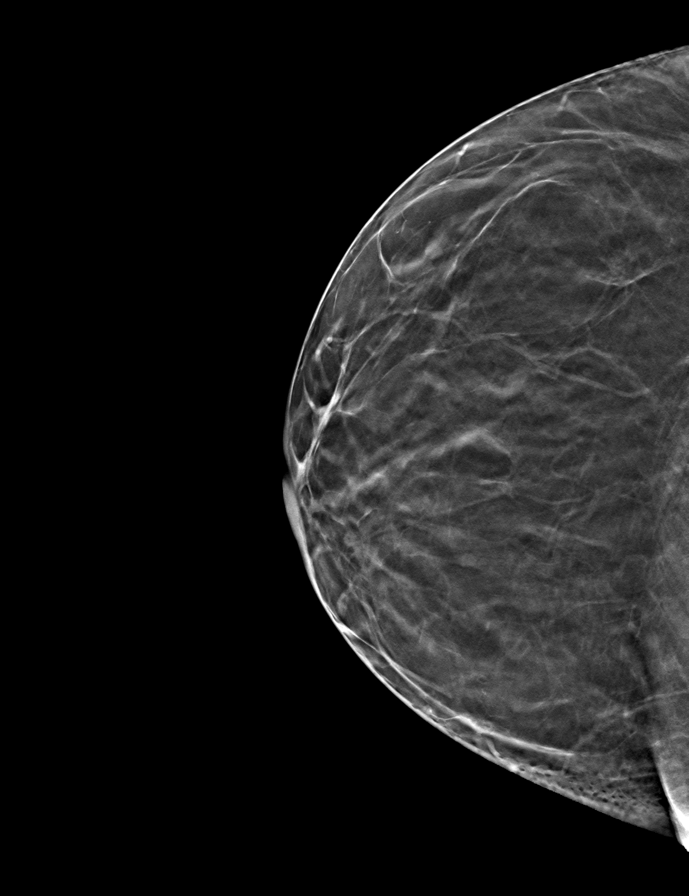
[frame 27/53]
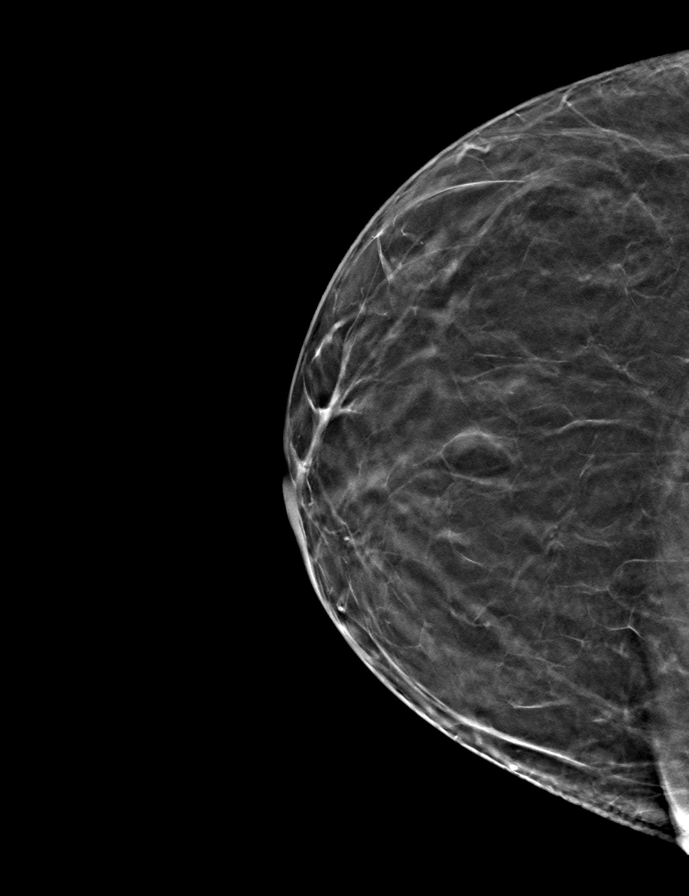

[L MLO tomo · tomo slice 32/63.0]
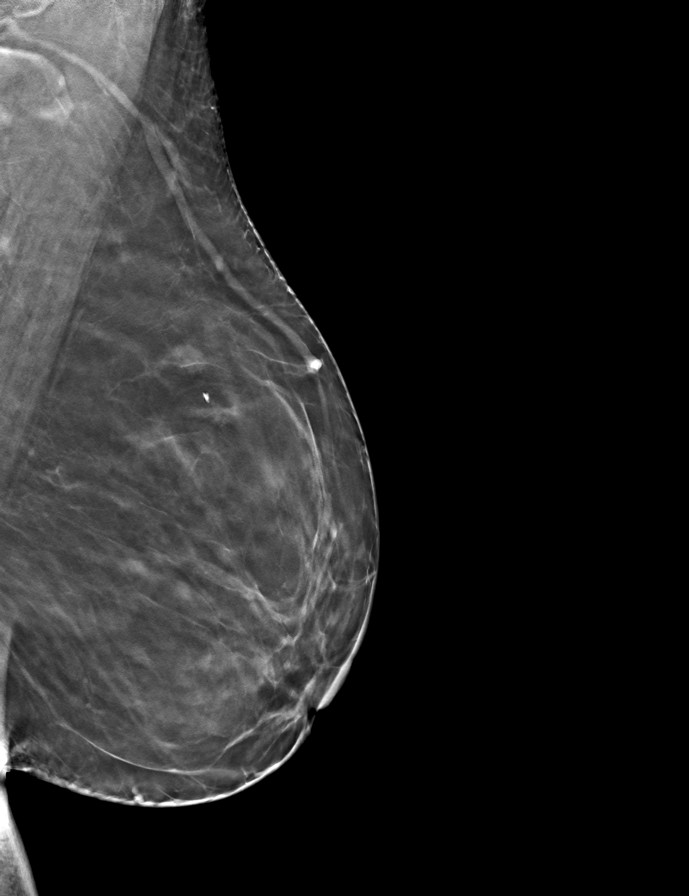

[L CC tomo · tomo slice 26/51.0]
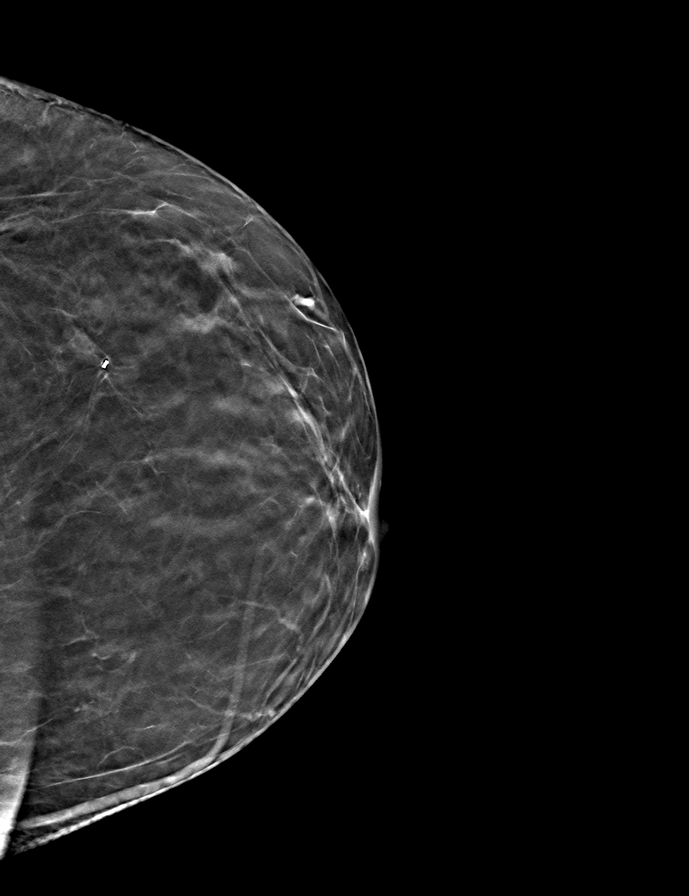

[R MLO tomo · tomo slice 31/60.0]
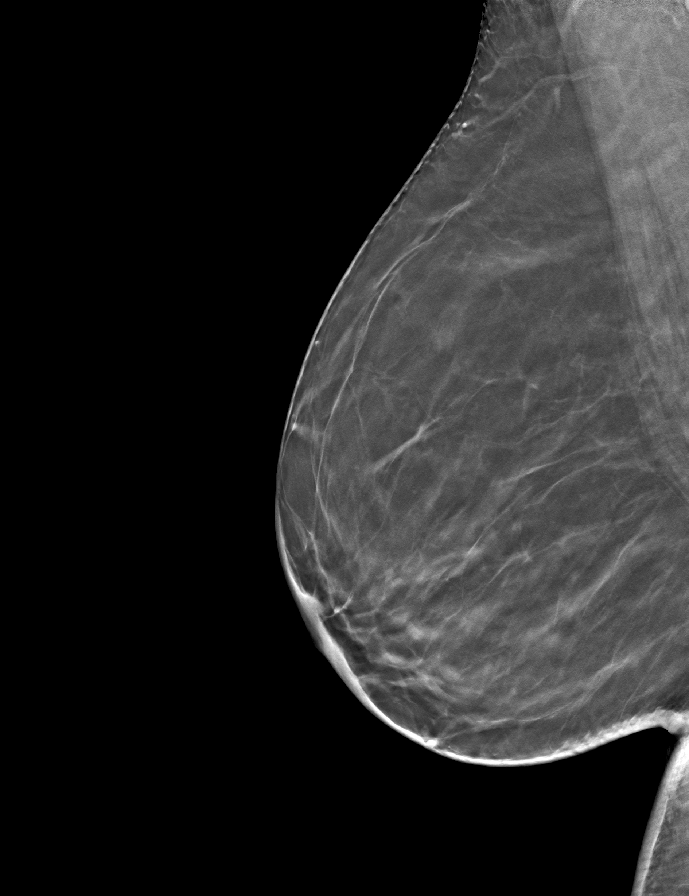

[9 of 24 positions shown; findings below may reference images not displayed]

ACR Breast Density Category b: There are scattered areas of
fibroglandular density.
FINDINGS: There are no findings suspicious for malignancy.
IMPRESSION: No mammographic evidence of malignancy. A result letter of this
screening mammogram will be mailed directly to the patient.

RECOMMENDATION:
Screening mammogram in one year. (Code:XG-X-X7B)

BI-RADS CATEGORY  1: Negative.

## 2023-07-07 ENCOUNTER — Ambulatory Visit
Admission: RE | Admit: 2023-07-07 | Discharge: 2023-07-07 | Disposition: A | Discharge status: Home / Self Care | Source: Ambulatory Visit | Attending: Internal Medicine | Admitting: Internal Medicine

## 2023-07-07 DIAGNOSIS — Z1231 Encounter for screening mammogram for malignant neoplasm of breast: Secondary | ICD-10-CM

## 2023-08-14 ENCOUNTER — Other Ambulatory Visit: Payer: Self-pay | Admitting: "Endocrinology

## 2023-08-31 ENCOUNTER — Encounter (INDEPENDENT_AMBULATORY_CARE_PROVIDER_SITE_OTHER): Payer: Self-pay | Admitting: *Deleted

## 2023-09-14 ENCOUNTER — Other Ambulatory Visit: Payer: Self-pay | Admitting: Medical Genetics

## 2023-09-15 LAB — TSH: TSH: 0.106 u[IU]/mL — ABNORMAL LOW (ref 0.450–4.500)

## 2023-09-15 LAB — COMPREHENSIVE METABOLIC PANEL WITH GFR
ALT: 31 IU/L (ref 0–32)
AST: 22 IU/L (ref 0–40)
Albumin: 3.9 g/dL (ref 3.8–4.9)
Alkaline Phosphatase: 114 IU/L (ref 44–121)
BUN/Creatinine Ratio: 21 (ref 9–23)
BUN: 17 mg/dL (ref 6–24)
Bilirubin Total: 0.3 mg/dL (ref 0.0–1.2)
CO2: 21 mmol/L (ref 20–29)
Calcium: 9 mg/dL (ref 8.7–10.2)
Chloride: 105 mmol/L (ref 96–106)
Creatinine, Ser: 0.81 mg/dL (ref 0.57–1.00)
Globulin, Total: 3 g/dL (ref 1.5–4.5)
Glucose: 200 mg/dL — ABNORMAL HIGH (ref 70–99)
Potassium: 4.4 mmol/L (ref 3.5–5.2)
Sodium: 137 mmol/L (ref 134–144)
Total Protein: 6.9 g/dL (ref 6.0–8.5)
eGFR: 86 mL/min/1.73 (ref >59)

## 2023-09-15 LAB — LIPID PANEL
Chol/HDL Ratio: 6.9 ratio — ABNORMAL HIGH (ref 0.0–4.4)
Cholesterol, Total: 255 mg/dL — ABNORMAL HIGH (ref 100–199)
HDL: 37 mg/dL — ABNORMAL LOW (ref >39)
LDL Chol Calc (NIH): 141 mg/dL — ABNORMAL HIGH (ref 0–99)
Triglycerides: 419 mg/dL — ABNORMAL HIGH (ref 0–149)
VLDL Cholesterol Cal: 77 mg/dL — ABNORMAL HIGH (ref 5–40)

## 2023-09-15 LAB — T4, FREE: Free T4: 0.95 ng/dL (ref 0.82–1.77)

## 2023-09-17 ENCOUNTER — Encounter: Payer: Self-pay | Admitting: "Endocrinology

## 2023-09-17 ENCOUNTER — Ambulatory Visit: Admitting: "Endocrinology

## 2023-09-17 VITALS — BP 116/78 | HR 76 | Ht 59.0 in | Wt 158.0 lb

## 2023-09-17 DIAGNOSIS — E782 Mixed hyperlipidemia: Secondary | ICD-10-CM

## 2023-09-17 DIAGNOSIS — I10 Essential (primary) hypertension: Secondary | ICD-10-CM | POA: Diagnosis not present

## 2023-09-17 DIAGNOSIS — E6609 Other obesity due to excess calories: Secondary | ICD-10-CM | POA: Insufficient documentation

## 2023-09-17 DIAGNOSIS — Z6831 Body mass index (BMI) 31.0-31.9, adult: Secondary | ICD-10-CM

## 2023-09-17 DIAGNOSIS — Z794 Long term (current) use of insulin: Secondary | ICD-10-CM

## 2023-09-17 DIAGNOSIS — E1165 Type 2 diabetes mellitus with hyperglycemia: Secondary | ICD-10-CM

## 2023-09-17 DIAGNOSIS — E66811 Obesity, class 1: Secondary | ICD-10-CM

## 2023-09-17 LAB — POCT GLYCOSYLATED HEMOGLOBIN (HGB A1C): HbA1c, POC (controlled diabetic range): 7.8 % — AB (ref 0.0–7.0)

## 2023-09-17 MED ORDER — EZETIMIBE 10 MG PO TABS: 10.0000 mg | ORAL_TABLET | Freq: Every day | ORAL | 1 refills | Status: AC

## 2023-09-17 NOTE — Patient Instructions (Signed)

## 2023-09-17 NOTE — Progress Notes (Signed)
 09/17/2023, 10:53 AM  Endocrinology follow-up note   Subjective:    Patient ID: Michele Myers, female    DOB: 10/07/1970.  Michele Myers is being seen in follow-up after she was seen in consultation for management of currently uncontrolled symptomatic diabetes requested by  Rosamond Leta NOVAK, MD.   Past Medical History:  Diagnosis Date   Angina at rest [facility])    Backache    Bipolar 1 disorder, depressed (HCC)    Diabetes mellitus without complication (HCC)    Diabetes mellitus, type II (HCC)    Dizzy spells    Glucosuria    History of seizures    Hyperlipidemia    Hypertension    Menopausal hot flushes    PTSD (post-traumatic stress disorder)    Tachycardia     Past Surgical History:  Procedure Laterality Date   ABDOMINAL HYSTERECTOMY     APPENDECTOMY     CARPAL TUNNEL RELEASE     CESAREAN SECTION     3   CHOLECYSTECTOMY  01/2016   ROTATOR CUFF REPAIR      Social History   Socioeconomic History   Marital status: Single    Spouse name: Not on file   Number of children: Not on file   Years of education: Not on file   Highest education level: Not on file  Occupational History   Not on file  Tobacco Use   Smoking status: Former    Current packs/day: 0.00    Types: Cigarettes    Quit date: 09/10/2020    Years since quitting: 3.0   Smokeless tobacco: Current   Tobacco comments:    vapes  Vaping Use   Vaping status: Every Day  Substance and Sexual Activity   Alcohol use: Not Currently    Comment: history of etoh abuse for 9 years, clean since 1998   Drug use: Never   Sexual activity: Not Currently  Other Topics Concern   Not on file  Social History Narrative   Not on file   Social Drivers of Health   Financial Resource Strain: Low Risk  (01/27/2023)   Received from [facility]   Overall Financial Resource Strain (CARDIA)    Difficulty of Paying Living Expenses: Not hard at  all  Food Insecurity: No Food Insecurity (01/27/2023)   Received from [facility]   Hunger Vital Sign    Within the past 12 months, you worried that your food would run out before you got the money to buy more.: Never true    Within the past 12 months, the food you bought just didn't last and you didn't have money to get more.: Never true  Transportation Needs: No Transportation Needs (01/27/2023)   Received from [facility] - Transportation    Lack of Transportation (Medical): No    Lack of Transportation (Non-Medical): No  Physical Activity: Sufficiently Active (01/27/2023)   Received from [facility]   Exercise Vital Sign    On average, how many days per week do you engage in moderate to strenuous exercise (like a brisk walk)?: 7 days    On average,  how many minutes do you engage in exercise at this level?: 30 min  Stress: Stress Concern Present (01/27/2023)   Received from [facility] - Occupational Stress Questionnaire    Feeling of Stress : To some extent  Social Connections: Moderately Isolated (01/27/2023)   Received from [facility]   Social Connection and Isolation Panel    In a typical week, how many times do you talk on the phone with family, friends, or neighbors?: More than three times a week    How often do you get together with friends or relatives?: Once a week    How often do you attend church or religious services?: Never    Do you belong to any clubs or organizations such as church groups, unions, fraternal or athletic groups, or school groups?: No    How often do you attend meetings of the clubs or organizations you belong to?: Never    Are you married, widowed, divorced, separated, never married, or living with a partner?: Living with partner    Family History  Problem Relation Age of Onset   Cancer Mother        thyroid    Kidney disease Mother    Thyroid  disease Mother    Diabetes Mother     Hyperlipidemia Mother    Osteoporosis Mother    Cancer Father        prostate, small cell lung cancer   Hyperlipidemia Father    Hypertension Father    Heart attack Father    Breast cancer Sister    Ovarian cancer Sister    Throat cancer Sister    Heart failure Brother    Diabetes Brother    Kidney disease Brother    Pancreatic cancer Maternal Grandmother    Throat cancer Maternal Grandmother    Breast cancer Maternal Aunt    Cancer Maternal Uncle    Colon cancer Paternal Uncle        possible   Inflammatory bowel disease Neg Hx    Crohn's disease Neg Hx    Celiac disease Neg Hx     Outpatient Encounter Medications as of 09/17/2023  Medication Sig   ezetimibe  (ZETIA ) 10 MG tablet Take 1 tablet (10 mg total) by mouth daily.   alendronate (FOSAMAX) 70 MG tablet Take 70 mg by mouth once a week.   atorvastatin (LIPITOR) 80 MG tablet Take 80 mg by mouth daily.   Blood Glucose Monitoring Suppl (ACCU-CHEK GUIDE ME) w/Device KIT 1 Piece by Does not apply route as directed.   Continuous Blood Gluc Receiver (DEXCOM G7 RECEIVER) DEVI USE FOR CONTINUOUSE BLOOD GLUCOSE MONITORING.   FLUoxetine (PROZAC) 40 MG capsule Take 40 mg by mouth daily.   glucose blood (ACCU-CHEK GUIDE) test strip USE TO CHECK BOOD GLUCOSE  4 TIMES DAILY   ibuprofen (ADVIL) 800 MG tablet 800 mg every 8 (eight) hours as needed.   insulin  glargine (LANTUS) 100 UNIT/ML Solostar Pen Inject 50 Units into the skin at bedtime.   Insulin  Pen Needle (EMBECTA PEN NEEDLE NANO 2 GEN) 32G X 4 MM MISC Use to inject insulin  four times daily as directed   lisinopril (ZESTRIL) 20 MG tablet Take 20 mg by mouth daily.   meloxicam (MOBIC) 7.5 MG tablet Take 7.5 mg by mouth daily.   methocarbamol (ROBAXIN) 500 MG tablet Take 500 mg by mouth 2 (two) times daily.   nitroGLYCERIN (NITROSTAT) 0.4 MG SL tablet Place 0.4 mg under the tongue  every 5 (five) minutes as needed for chest pain.   NOVOLOG FLEXPEN 100 UNIT/ML FlexPen Inject 12-15  Units into the skin 3 (three) times daily before meals.   omeprazole (PRILOSEC) 40 MG capsule Take 40 mg by mouth every morning.   No facility-administered encounter medications on file as of 09/17/2023.    ALLERGIES: Allergies  Allergen Reactions   Codeine Anxiety    Rapid heart beat    VACCINATION STATUS:  There is no immunization history on file for this patient.  Diabetes She presents for her follow-up diabetic visit. She has type 2 diabetes mellitus. Onset time: she was diagnosed at approximate age of 38 yrs. Her disease course has been worsening. There are no hypoglycemic associated symptoms. Pertinent negatives for hypoglycemia include no headaches, seizures or tremors. Pertinent negatives for diabetes include no blurred vision, no chest pain, no polydipsia and no polyuria. There are no hypoglycemic complications. Symptoms are worsening. There are no diabetic complications. Risk factors for coronary artery disease include dyslipidemia, diabetes mellitus, family history, sedentary lifestyle and tobacco exposure. Current diabetic treatment includes oral agent (monotherapy). Her weight is fluctuating minimally. She is following a generally unhealthy diet. When asked about meal planning, she reported none. She has not had a previous visit with a dietitian. She never participates in exercise. Her home blood glucose trend is increasing steadily. Her breakfast blood glucose range is generally >200 mg/dl. Her lunch blood glucose range is generally >200 mg/dl. Her dinner blood glucose range is generally >200 mg/dl. Her bedtime blood glucose range is generally >200 mg/dl. Her overall blood glucose range is >200 mg/dl. Michele Myers presents with significantly above target glycemic profile averaging 229 mg per DL for the most recent 2 weeks.  Her Dexcom CGM shows 21% time in range, 45% Libre 1 hyperglycemia, 34% Libre to hyperglycemia.  She has no hypoglycemia.  Her point-of-care A1c is 7.8% increasing from  7.3% during her last visit.    ) An ACE inhibitor/angiotensin II receptor blocker is being taken. Eye exam is current.  Hyperlipidemia This is a chronic problem. The current episode started more than 1 year ago. The problem is uncontrolled. Pertinent negatives include no chest pain, myalgias or shortness of breath. Current antihyperlipidemic treatment includes statins. Risk factors for coronary artery disease include diabetes mellitus, dyslipidemia, family history, hypertension and a sedentary lifestyle.  Hypertension This is a chronic problem. The current episode started more than 1 year ago. Pertinent negatives include no blurred vision, chest pain, headaches, palpitations or shortness of breath. Risk factors for coronary artery disease include diabetes mellitus, dyslipidemia, sedentary lifestyle and smoking/tobacco exposure. Past treatments include ACE inhibitors.   Okay  Objective:       09/17/2023    9:24 AM 05/14/2023    8:47 AM 04/08/2023    8:53 AM  Vitals with BMI  Height 4' 11 4' 11 4' 11  Weight 158 lbs 150 lbs 10 oz 149 lbs  BMI 31.89 30.4 30.08  Systolic 116 128 873  Diastolic 78 84 74  Pulse 76 72 72    BP 116/78   Pulse 76   Ht 4' 11 (1.499 m)   Wt 158 lb (71.7 kg)   BMI 31.91 kg/m   Wt Readings from Last 3 Encounters:  09/17/23 158 lb (71.7 kg)  05/14/23 150 lb 9.6 oz (68.3 kg)  04/08/23 149 lb (67.6 kg)     CMP ( most recent) CMP     Component Value Date/Time   NA 137 09/14/2023 1108  K 4.4 09/14/2023 1108   CL 105 09/14/2023 1108   CO2 21 09/14/2023 1108   GLUCOSE 200 (H) 09/14/2023 1108   GLUCOSE 311 (H) 12/18/2019 0858   BUN 17 09/14/2023 1108   CREATININE 0.81 09/14/2023 1108   CALCIUM 9.0 09/14/2023 1108   PROT 6.9 09/14/2023 1108   ALBUMIN 3.9 09/14/2023 1108   AST 22 09/14/2023 1108   ALT 31 09/14/2023 1108   ALKPHOS 114 09/14/2023 1108   BILITOT 0.3 09/14/2023 1108   GFRNONAA >60 12/18/2019 0849     Diabetic Labs (most  recent): Lab Results  Component Value Date   HGBA1C 7.8 (A) 09/17/2023   HGBA1C 7.3 (A) 05/14/2023   HGBA1C 14.5 (A) 09/24/2021   MICROALBUR 10 05/14/2023   MICROALBUR <0.2 10/08/2020     Lipid Panel ( most recent) Lipid Panel     Component Value Date/Time   CHOL 255 (H) 09/14/2023 1108   TRIG 419 (H) 09/14/2023 1108   HDL 37 (L) 09/14/2023 1108   CHOLHDL 6.9 (H) 09/14/2023 1108   LDLCALC 141 (H) 09/14/2023 1108   LABVLDL 77 (H) 09/14/2023 1108      Lab Results  Component Value Date   TSH 0.106 (L) 09/14/2023   TSH 0.20 (A) 07/01/2021   TSH 0.06 (A) 10/08/2020   FREET4 0.95 09/14/2023     Assessment & Plan:   1. Type 2 diabetes mellitus with hyperglycemia, without long-term current use of insulin  (HCC)   - Michele Myers has currently uncontrolled symptomatic type 2 DM since  53 years of age.  Michele Myers presents with significantly above target glycemic profile averaging 229 mg per DL for the most recent 2 weeks.  Her Dexcom CGM shows 21% time in range, 45% Libre 1 hyperglycemia, 34% Libre to hyperglycemia.  She has no hypoglycemia.  Her point-of-care A1c is 7.8% increasing from 7.3% during her last visit.    Recent labs reviewed. - I had a long discussion with her about the progressive nature of diabetes and the pathology behind its complications. 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 These are all discussed in detail with her.  - I have counseled her on diet  and weight management  by adopting a carbohydrate restricted/protein rich diet. Patient is encouraged to switch to  unprocessed or minimally processed     complex starch and increased protein intake (animal or plant source), fruits, and vegetables. -  she is advised to stick to a routine mealtimes to eat 3 meals  a day and avoid  unnecessary snacks ( to snack only to correct hypoglycemia).  - she acknowledges that there is a room for improvement in her food and drink choices. - Suggestion is made for her to avoid simple carbohydrates  from her diet including Cakes, Sweet Desserts, Ice Cream, Soda (diet and regular), Sweet Tea, Candies, Chips, Cookies, Store Bought Juices, Alcohol in Excess of  1-2 drinks a day, Artificial Sweeteners,  Coffee Creamer, and Sugar-free Products, Lemonade. This will help patient to have more stable blood glucose profile and potentially avoid unintended weight gain.   - she will be scheduled with Penny Crumpton, RDN, CDE for diabetes education.  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    She presents with significantly above target glycemic profile, she will continue to need intensive treatment with basal/bolus insulin  in order for her to achieve control of diabetes to target.    Accordingly, she is advised to increase Lantus to 50 units nightly, increase NovoLog to 12 -15 units 3 times daily AC if Premeal blood glucose readings are above 90 mg per DL  Patient specific correction dose was given to her in writing.  She is not a suitable candidate for metformin  or GLP-1 receptor agonist.  She does not have appropriate coverage for medications, she wishes to avoid  more of expensive prescriptions.  She does not tolerate metformin . -She is encouraged to utilize her CGM continuously. -- she is encouraged to call clinic for blood glucose levels less than 70 or above 200 mg /dl weekly average.    - Specific targets for  A1c;  LDL, HDL,  and Triglycerides were discussed with the patient.  2) Blood Pressure /Hypertension:  -Her blood pressure is controlled.  she is advised to continue her current medications  including lisinopril 5 mg p.o. daily with breakfast .   3) Lipids/Hyperlipidemia:   Review of her recent lipid panel showed uncontrolled but improving to LDL of 141 from 189.  She is advised to continue atorvastatin 80 mg p.o. nightly.   I discussed and added Zetia  10 mg p.o. nightly.   Side effects and precautions discussed with her.  4)  Weight/Diet:  Body mass index is 31.91 kg/m.  -Due to her past history of EtOH with risk of exocrine pancreatic insufficiency, major weight loss is not recommended to her.  See above on macronutrients recommendations.   5) Chronic Care/Health Maintenance:  -she  is on ACEI/ARB and Statin medications and  is encouraged to initiate and continue to follow up with Ophthalmology, Dentist,  Podiatrist at least yearly or according to recommendations, and advised to  quit smoking. I have recommended yearly flu vaccine and pneumonia vaccine at least every 5 years; moderate intensity exercise for up to 150 minutes weekly; and  sleep for 7- 9 hours a day.    - she is  advised to maintain close follow up with Rosamond Leta NOVAK, MD for primary care needs, as well as her other providers for optimal and coordinated care.   I spent  42  minutes in the care of the patient today including review of labs from CMP, Lipids, Thyroid  Function, Hematology (current and previous including abstractions from other facilities); face-to-face time discussing  her blood glucose readings/logs, discussing hypoglycemia and hyperglycemia episodes and symptoms, medications doses, her options of short and long term treatment based on the latest standards of care / guidelines;  discussion about incorporating lifestyle medicine;  and documenting the encounter. Risk reduction counseling performed per USPSTF guidelines to reduce  obesity and cardiovascular risk factors.     Please refer to Patient Instructions for Blood Glucose Monitoring and Insulin /Medications Dosing Guide  in media tab for additional  information. Please  also refer to  Patient Self Inventory in the Media  tab for reviewed elements of pertinent patient history.  Michele Myers participated in the discussions, expressed understanding, and voiced agreement with the above plans.  All questions were answered to her satisfaction. she is encouraged to contact clinic should she have any questions or concerns prior to her return visit.   Follow up plan: - Return in about 3 months (around 12/17/2023) for F/U with Pre-visit Labs,  Meter/CGM/Logs, A1c here.  Ranny Earl, MD [facility] [address] [place], [place] [address] Phone: [phone]  Fax: [phone]    09/17/2023, 10:53 AM  This note was partially dictated with voice recognition software. Similar sounding words can be transcribed inadequately or may not  be corrected upon review.

## 2023-10-02 ENCOUNTER — Other Ambulatory Visit: Payer: Self-pay | Admitting: "Endocrinology

## 2023-11-06 ENCOUNTER — Encounter: Payer: Self-pay | Admitting: Orthopedic Surgery

## 2023-11-06 ENCOUNTER — Other Ambulatory Visit (INDEPENDENT_AMBULATORY_CARE_PROVIDER_SITE_OTHER): Payer: Self-pay

## 2023-11-06 ENCOUNTER — Ambulatory Visit: Admitting: Orthopedic Surgery

## 2023-11-06 VITALS — BP 125/83 | HR 78 | Ht 59.0 in | Wt 158.0 lb

## 2023-11-06 DIAGNOSIS — M1812 Unilateral primary osteoarthritis of first carpometacarpal joint, left hand: Secondary | ICD-10-CM | POA: Diagnosis not present

## 2023-11-06 DIAGNOSIS — M79642 Pain in left hand: Secondary | ICD-10-CM

## 2023-11-06 NOTE — Progress Notes (Signed)
 New Patient Visit  Assessment: Michele Myers is a 53 y.o. female with the following: 1. Primary osteoarthritis of first carpometacarpal joint of left hand  Plan: Michele Myers has diffuse pain about the hand, as well as the wrist on the left.  Radiographs demonstrates CMC arthritis of the thumb.  We discussed multiple treatment options.  Recommended she continue to take meloxicam.  She was offered a brace.  She is not interested in injections.  If she continues to have issues, I would recommend that we try an injection in the future.  Otherwise, continue with the brace as needed.  Medications as needed.  She can also consider topical treatments.  Follow-up as needed.  Follow-up: Return if symptoms worsen or fail to improve.  Subjective:  Chief Complaint  Patient presents with   Hand Pain    Left / feels broken but denies injury     History of Present Illness: Michele Myers is a 53 y.o. female who presents for evaluation of left hand pain.  She has diffuse pain in the left hand and wrist.  No specific injury.  She is right-hand dominant.  She localizes the pain to the thumb, radiating proximally.  She also has some pain in the dorsal aspect of the wrist.  History of carpal tunnel syndrome requiring surgery, 20 years ago.  She is denying numbness and tingling currently.  She is taking meloxicam for aches and pains.  No specific treatment for her left hand.  She has not had injections.  She is not using a brace.   Review of Systems: No fevers or chills No numbness or tingling No chest pain No shortness of breath No bowel or bladder dysfunction No GI distress No headaches   Medical History:  Past Medical History:  Diagnosis Date   Angina at rest    Backache    Bipolar 1 disorder, depressed (HCC)    Diabetes mellitus without complication (HCC)    Diabetes mellitus, type II (HCC)    Dizzy spells    Glucosuria    History of seizures    Hyperlipidemia    Hypertension     Menopausal hot flushes    PTSD (post-traumatic stress disorder)    Tachycardia     Past Surgical History:  Procedure Laterality Date   ABDOMINAL HYSTERECTOMY     APPENDECTOMY     CARPAL TUNNEL RELEASE     CESAREAN SECTION     3   CHOLECYSTECTOMY  01/2016   ROTATOR CUFF REPAIR      Family History  Problem Relation Age of Onset   Cancer Mother        thyroid    Kidney disease Mother    Thyroid  disease Mother    Diabetes Mother    Hyperlipidemia Mother    Osteoporosis Mother    Cancer Father        prostate, small cell lung cancer   Hyperlipidemia Father    Hypertension Father    Heart attack Father    Breast cancer Sister    Ovarian cancer Sister    Throat cancer Sister    Heart failure Brother    Diabetes Brother    Kidney disease Brother    Pancreatic cancer Maternal Grandmother    Throat cancer Maternal Grandmother    Breast cancer Maternal Aunt    Cancer Maternal Uncle    Colon cancer Paternal Uncle        possible   Inflammatory bowel disease Neg Hx  Crohn's disease Neg Hx    Celiac disease Neg Hx    Social History   Tobacco Use   Smoking status: Former    Current packs/day: 0.00    Types: Cigarettes    Quit date: 09/10/2020    Years since quitting: 3.1   Smokeless tobacco: Current   Tobacco comments:    vapes  Vaping Use   Vaping status: Every Day  Substance Use Topics   Alcohol use: Not Currently    Comment: history of etoh abuse for 9 years, clean since 1998   Drug use: Never    Allergies  Allergen Reactions   Codeine Anxiety    Rapid heart beat    Current Meds  Medication Sig   glipiZIDE  (GLUCOTROL  XL) 10 MG 24 hr tablet Take 10 mg by mouth daily.    Objective: BP 125/83   Pulse 78   Ht 4' 11 (1.499 m)   Wt 158 lb (71.7 kg)   BMI 31.91 kg/m   Physical Exam:  General: Alert and oriented. and No acute distress. Gait: Normal gait.  Left hand without obvious deformity.  No redness.  Well-healed volar based surgical incision.   No surrounding erythema or drainage.  She has good grip strength.  She can make a fist, but notes restricted motion of the left thumb.  She has some pain in the wrist.  Fingers are warm and well-perfused.  Sensation intact throughout the left hand.  Positive CMC grind test.  IMAGING: I personally ordered and reviewed the following images   X-rays of the left hand were obtained in clinic today.  No acute injuries are noted.  No dislocation.  There are degenerative changes noted at the first [facility] joint.  Well-maintained joint space within the wrist.  No bony lesions.  Impression: Left thumb CMC arthritis   New Medications:  No orders of the defined types were placed in this encounter.     Michele DELENA Horde, MD  11/06/2023 10:07 AM

## 2023-11-20 ENCOUNTER — Other Ambulatory Visit: Payer: Self-pay | Admitting: "Endocrinology

## 2023-12-10 LAB — FIB-4 W/REFLEX TO ELF
ALT: 17 IU/L (ref 0–32)
AST: 18 IU/L (ref 0–40)
FIB-4 Index: 0.75 (ref 0.00–2.67)
Platelets: 322 x10E3/uL (ref 150–450)

## 2023-12-10 LAB — TSH: TSH: 0.08 u[IU]/mL — ABNORMAL LOW (ref 0.450–4.500)

## 2023-12-10 LAB — LIPID PANEL
Chol/HDL Ratio: 7.4 ratio — ABNORMAL HIGH (ref 0.0–4.4)
Cholesterol, Total: 320 mg/dL — ABNORMAL HIGH (ref 100–199)
HDL: 43 mg/dL (ref >39)
LDL Chol Calc (NIH): 173 mg/dL — ABNORMAL HIGH (ref 0–99)
Triglycerides: 516 mg/dL — ABNORMAL HIGH (ref 0–149)
VLDL Cholesterol Cal: 104 mg/dL — ABNORMAL HIGH (ref 5–40)

## 2023-12-10 LAB — T3: T3, Total: 109 ng/dL (ref 71–180)

## 2023-12-10 LAB — T4, FREE: Free T4: 1.23 ng/dL (ref 0.82–1.77)

## 2023-12-10 LAB — THYROGLOBULIN ANTIBODY: Thyroglobulin Antibody: <1 [IU]/mL (ref 0.0–0.9)

## 2023-12-10 LAB — THYROID PEROXIDASE ANTIBODY: Thyroperoxidase Ab SerPl-aCnc: 11 [IU]/mL (ref 0–34)

## 2023-12-29 ENCOUNTER — Encounter (HOSPITAL_COMMUNITY): Payer: Self-pay

## 2023-12-29 ENCOUNTER — Ambulatory Visit: Admitting: "Endocrinology

## 2023-12-29 ENCOUNTER — Encounter: Payer: Self-pay | Admitting: "Endocrinology

## 2023-12-29 ENCOUNTER — Emergency Department (HOSPITAL_COMMUNITY)
Admission: EM | Admit: 2023-12-29 | Discharge: 2023-12-29 | Disposition: A | Discharge status: Home / Self Care | Attending: Emergency Medicine | Admitting: Emergency Medicine

## 2023-12-29 ENCOUNTER — Other Ambulatory Visit: Payer: Self-pay

## 2023-12-29 VITALS — BP 127/84 | HR 79 | Resp 18 | Ht 59.0 in | Wt 146.8 lb

## 2023-12-29 DIAGNOSIS — E039 Hypothyroidism, unspecified: Secondary | ICD-10-CM | POA: Insufficient documentation

## 2023-12-29 DIAGNOSIS — E1165 Type 2 diabetes mellitus with hyperglycemia: Secondary | ICD-10-CM | POA: Insufficient documentation

## 2023-12-29 DIAGNOSIS — I10 Essential (primary) hypertension: Secondary | ICD-10-CM

## 2023-12-29 DIAGNOSIS — Z79899 Other long term (current) drug therapy: Secondary | ICD-10-CM | POA: Insufficient documentation

## 2023-12-29 DIAGNOSIS — E782 Mixed hyperlipidemia: Secondary | ICD-10-CM

## 2023-12-29 DIAGNOSIS — R42 Dizziness and giddiness: Secondary | ICD-10-CM | POA: Diagnosis present

## 2023-12-29 DIAGNOSIS — Z7982 Long term (current) use of aspirin: Secondary | ICD-10-CM | POA: Insufficient documentation

## 2023-12-29 DIAGNOSIS — Z794 Long term (current) use of insulin: Secondary | ICD-10-CM

## 2023-12-29 DIAGNOSIS — R739 Hyperglycemia, unspecified: Secondary | ICD-10-CM

## 2023-12-29 LAB — POCT GLYCOSYLATED HEMOGLOBIN (HGB A1C)
HbA1c POC (<> result, manual entry): >15 %
Hemoglobin A1C: 15 % — AB (ref 4.0–5.6)

## 2023-12-29 LAB — BASIC METABOLIC PANEL WITH GFR
Anion gap: 13 (ref 5–15)
BUN: 11 mg/dL (ref 6–20)
CO2: 20 mmol/L — ABNORMAL LOW (ref 22–32)
Calcium: 8.3 mg/dL — ABNORMAL LOW (ref 8.9–10.3)
Chloride: 102 mmol/L (ref 98–111)
Creatinine, Ser: 0.71 mg/dL (ref 0.44–1.00)
GFR, Estimated: >60 mL/min
Glucose, Bld: 370 mg/dL — ABNORMAL HIGH (ref 70–99)
Potassium: 3.8 mmol/L (ref 3.5–5.1)
Sodium: 134 mmol/L — ABNORMAL LOW (ref 135–145)

## 2023-12-29 LAB — CBG MONITORING, ED
Glucose-Capillary: 501 mg/dL (ref 70–99)
Glucose-Capillary: 364 mg/dL — ABNORMAL HIGH (ref 70–99)
Glucose-Capillary: 199 mg/dL — ABNORMAL HIGH (ref 70–99)

## 2023-12-29 LAB — POCT GLUCOSE FINGERSTICK

## 2023-12-29 LAB — COMPREHENSIVE METABOLIC PANEL WITH GFR
ALT: 16 U/L (ref 0–44)
AST: 14 U/L — ABNORMAL LOW (ref 15–41)
Albumin: 4 g/dL (ref 3.5–5.0)
Alkaline Phosphatase: 128 U/L — ABNORMAL HIGH (ref 38–126)
Anion gap: 16 — ABNORMAL HIGH (ref 5–15)
BUN: 12 mg/dL (ref 6–20)
CO2: 19 mmol/L — ABNORMAL LOW (ref 22–32)
Calcium: 8.9 mg/dL (ref 8.9–10.3)
Chloride: 95 mmol/L — ABNORMAL LOW (ref 98–111)
Creatinine, Ser: 0.82 mg/dL (ref 0.44–1.00)
GFR, Estimated: >60 mL/min
Glucose, Bld: 524 mg/dL (ref 70–99)
Potassium: 3.9 mmol/L (ref 3.5–5.1)
Sodium: 130 mmol/L — ABNORMAL LOW (ref 135–145)
Total Bilirubin: 0.3 mg/dL (ref 0.0–1.2)
Total Protein: 7.1 g/dL (ref 6.5–8.1)

## 2023-12-29 LAB — BLOOD GAS, VENOUS
Acid-base deficit: 0.2 mmol/L (ref 0.0–2.0)
Bicarbonate: 24 mmol/L (ref 20.0–28.0)
Drawn by: 42726
O2 Saturation: 97.3 %
Patient temperature: 36.7
pCO2, Ven: 36 mmHg — ABNORMAL LOW (ref 44–60)
pH, Ven: 7.42 (ref 7.25–7.43)
pO2, Ven: 70 mmHg — ABNORMAL HIGH (ref 32–45)

## 2023-12-29 LAB — CBC
HCT: 36.1 % (ref 36.0–46.0)
Hemoglobin: 12.7 g/dL (ref 12.0–15.0)
MCH: 30.9 pg (ref 26.0–34.0)
MCHC: 35.2 g/dL (ref 30.0–36.0)
MCV: 87.8 fL (ref 80.0–100.0)
Platelets: 288 K/uL (ref 150–400)
RBC: 4.11 MIL/uL (ref 3.87–5.11)
RDW: 11.5 % (ref 11.5–15.5)
WBC: 7.1 K/uL (ref 4.0–10.5)
nRBC: 0 % (ref 0.0–0.2)

## 2023-12-29 LAB — URINALYSIS, ROUTINE W REFLEX MICROSCOPIC
Bacteria, UA: NONE SEEN
Bilirubin Urine: NEGATIVE
Glucose, UA: >=500 mg/dL — AB
Hgb urine dipstick: NEGATIVE
Ketones, ur: NEGATIVE mg/dL
Leukocytes,Ua: NEGATIVE
Nitrite: NEGATIVE
Protein, ur: NEGATIVE mg/dL
Specific Gravity, Urine: 1.026 (ref 1.005–1.030)
pH: 5 (ref 5.0–8.0)

## 2023-12-29 LAB — BETA-HYDROXYBUTYRIC ACID: Beta-Hydroxybutyric Acid: 0.08 mmol/L (ref 0.05–0.27)

## 2023-12-29 MED ORDER — SODIUM CHLORIDE 0.9 % IV BOLUS
1000.0000 mL | Freq: Once | INTRAVENOUS | Status: AC
  Administered 2023-12-29: 1000 mL via INTRAVENOUS

## 2023-12-29 MED ORDER — DEXTROSE 50 % IV SOLN: 0.0000 mL | INTRAVENOUS | Status: DC | PRN

## 2023-12-29 MED ORDER — INSULIN REGULAR(HUMAN) IN NACL 100-0.9 UT/100ML-% IV SOLN
INTRAVENOUS | Status: DC
  Administered 2023-12-29: 14 [IU]/h via INTRAVENOUS
  Filled 2023-12-29: qty 100

## 2023-12-29 MED ORDER — DEXTROSE IN LACTATED RINGERS 5 % IV SOLN: INTRAVENOUS | Status: DC

## 2023-12-29 MED ORDER — LACTATED RINGERS IV SOLN: INTRAVENOUS | Status: DC

## 2023-12-29 NOTE — Patient Instructions (Signed)

## 2023-12-29 NOTE — ED Triage Notes (Signed)
 Pt arrived via POV following PCP appointment for further evaluation of elevated CBG and A1C. Pt endorses dizziness as well.

## 2023-12-29 NOTE — ED Notes (Signed)
 Per EDP, Pts labs and CBG warranted no longer needing IV insulin 

## 2023-12-29 NOTE — Progress Notes (Signed)
 "                                                                              12/29/2023, 12:06 PM  Endocrinology follow-up note   Subjective:    Patient ID: Michele Myers, female    DOB: 1970/04/30.  Maryhelen Lindler Rhein is being seen in follow-up after she was seen in consultation for management of currently uncontrolled symptomatic diabetes requested by  Leavy Waddell NOVAK, FNP.   Past Medical History:  Diagnosis Date   Angina at rest    Backache    Bipolar 1 disorder, depressed (HCC)    Diabetes mellitus without complication (HCC)    Diabetes mellitus, type II (HCC)    Dizzy spells    Glucosuria    History of seizures    Hyperlipidemia    Hypertension    Menopausal hot flushes    PTSD (post-traumatic stress disorder)    Tachycardia     Past Surgical History:  Procedure Laterality Date   ABDOMINAL HYSTERECTOMY     APPENDECTOMY     CARPAL TUNNEL RELEASE     CESAREAN SECTION     3   CHOLECYSTECTOMY  01/2016   ROTATOR CUFF REPAIR      Social History   Socioeconomic History   Marital status: Single    Spouse name: Not on file   Number of children: Not on file   Years of education: Not on file   Highest education level: Not on file  Occupational History   Not on file  Tobacco Use   Smoking status: Former    Current packs/day: 0.00    Types: Cigarettes    Quit date: 09/10/2020    Years since quitting: 3.3   Smokeless tobacco: Current   Tobacco comments:    vapes  Vaping Use   Vaping status: Every Day  Substance and Sexual Activity   Alcohol use: Not Currently    Comment: history of etoh abuse for 9 years, clean since 1998   Drug use: Never   Sexual activity: Not Currently  Other Topics Concern   Not on file  Social History Narrative   Not on file   Social Drivers of Health   Tobacco Use: High Risk (12/29/2023)   Patient History    Smoking Tobacco Use: Former    Smokeless Tobacco Use: Current    Passive Exposure: Not on file  Financial Resource  Strain: Low Risk (01/27/2023)   Received from [facility]   Overall Financial Resource Strain (CARDIA)    Difficulty of Paying Living Expenses: Not hard at all  Food Insecurity: No Food Insecurity (01/27/2023)   Received from [facility]   Epic    Within the past 12 months, you worried that your food would run out before you got the money to buy more.: Never true    Within the past 12 months, the food you bought just didn't last and you didn't have money to get more.: Never true  Transportation Needs: No Transportation Needs (01/27/2023)   Received from [facility]   PRAPARE - Transportation    Lack of Transportation (Medical): No    Lack of Transportation (Non-Medical): No  Physical Activity: Sufficiently Active (01/27/2023)   Received from [facility]   Exercise Vital Sign    On average, how many days per week do you engage in moderate to strenuous exercise (like a brisk walk)?: 7 days    On average, how many minutes do you engage in exercise at this level?: 30 min  Stress: Stress Concern Present (01/27/2023)   Received from [facility] - Occupational Stress Questionnaire    Feeling of Stress : To some extent  Social Connections: Moderately Isolated (01/27/2023)   Received from [facility]   Social Connection and Isolation Panel    In a typical week, how many times do you talk on the phone with family, friends, or neighbors?: More than three times a week    How often do you get together with friends or relatives?: Once a week    How often do you attend church or religious services?: Never    Do you belong to any clubs or organizations such as church groups, unions, fraternal or athletic groups, or school groups?: No    How often do you attend meetings of the clubs or organizations you belong to?: Never    Are you married, widowed, divorced, separated, never married, or living with a partner?: Living with partner   Depression (PHQ2-9): Not on file  Alcohol Screen: Not on file  Housing: Not on file  Utilities: Low Risk (01/27/2023)   Received from [facility]   Utilities    Within the past 12 months, have you been unable to get utilities(heat, electricity) when it was really needed?: No  Health Literacy: Low Risk (01/27/2023)   Received from [facility]    How often do you need to have someone help you when you read instructions, pamphlets, or other written material from your doctor or pharmacy?: Never    Family History  Problem Relation Age of Onset   Cancer Mother        thyroid    Kidney disease Mother    Thyroid  disease Mother    Diabetes Mother    Hyperlipidemia Mother    Osteoporosis Mother    Cancer Father        prostate, small cell lung cancer   Hyperlipidemia Father    Hypertension Father    Heart attack Father    Breast cancer Sister    Ovarian cancer Sister    Throat cancer Sister    Heart failure Brother    Diabetes Brother    Kidney disease Brother    Pancreatic cancer Maternal Grandmother    Throat cancer Maternal Grandmother    Breast cancer Maternal Aunt    Cancer Maternal Uncle    Colon cancer Paternal Uncle        possible   Inflammatory bowel disease Neg Hx    Crohn's disease Neg Hx    Celiac disease Neg Hx     No facility-administered encounter medications on file as of 12/29/2023.   Outpatient Encounter Medications as of 12/29/2023  Medication Sig   alendronate (FOSAMAX) 70 MG tablet Take 70 mg by mouth once a week.   aspirin 81 MG chewable tablet 1 tablet Orally Once a day   atorvastatin (LIPITOR) 80 MG tablet Take 80 mg by mouth daily.   Blood Glucose Monitoring Suppl (ACCU-CHEK GUIDE ME) w/Device KIT 1 Piece by Does not apply route as directed.   Continuous Glucose Receiver (DEXCOM  G7 RECEIVER) DEVI USE FOR CONTINUOUSE BLOOD GLUCOSE MONITORING.   Continuous Glucose Sensor (DEXCOM G7 SENSOR) MISC 1 (ONE) EACH Q10 DAYS    ezetimibe  (ZETIA ) 10 MG tablet Take 1 tablet (10 mg total) by mouth daily.   FLUoxetine (PROZAC) 40 MG capsule Take 40 mg by mouth daily.   glucose blood (ACCU-CHEK GUIDE) test strip USE TO CHECK BOOD GLUCOSE  4 TIMES DAILY   ibuprofen (ADVIL) 800 MG tablet 800 mg every 8 (eight) hours as needed.   insulin  glargine (LANTUS) 100 UNIT/ML Solostar Pen Inject 50 Units into the skin at bedtime.   Insulin  Pen Needle (EMBECTA PEN NEEDLE NANO 2 GEN) 32G X 4 MM MISC Use to inject insulin  four times daily as directed   lisinopril (ZESTRIL) 20 MG tablet Take 20 mg by mouth daily.   meloxicam (MOBIC) 7.5 MG tablet Take 7.5 mg by mouth daily.   methocarbamol (ROBAXIN) 500 MG tablet Take 500 mg by mouth 2 (two) times daily.   nitroGLYCERIN (NITROSTAT) 0.4 MG SL tablet Place 0.4 mg under the tongue every 5 (five) minutes as needed for chest pain.   NOVOLOG FLEXPEN 100 UNIT/ML FlexPen Inject 10-16 Units into the skin 3 (three) times daily before meals.   omeprazole (PRILOSEC) 40 MG capsule Take 40 mg by mouth every morning.   [DISCONTINUED] glipiZIDE  (GLUCOTROL  XL) 10 MG 24 hr tablet Take 10 mg by mouth daily.    ALLERGIES: Allergies  Allergen Reactions   Codeine Anxiety    Rapid heart beat    VACCINATION STATUS:  There is no immunization history on file for this patient.  Diabetes She presents for her follow-up diabetic visit. She has type 2 diabetes mellitus. Onset time: she was diagnosed at approximate age of 38 yrs. Her disease course has been worsening. There are no hypoglycemic associated symptoms. Pertinent negatives for hypoglycemia include no headaches, seizures or tremors. Pertinent negatives for diabetes include no blurred vision, no chest pain, no polydipsia and no polyuria. There are no hypoglycemic complications. Symptoms are worsening. There are no diabetic complications. Risk factors for coronary artery disease include dyslipidemia, diabetes mellitus, family history, sedentary lifestyle  and tobacco exposure. Current diabetic treatment includes oral agent (monotherapy). Her weight is fluctuating minimally. She is following a generally unhealthy diet. When asked about meal planning, she reported none. She has not had a previous visit with a dietitian. She never participates in exercise. Her home blood glucose trend is increasing steadily. Her overall blood glucose range is >200 mg/dl. Alston presents with inadequate monitoring, her meter showing average glucose of 118 mg per DL for the most recent 30 days reading only 16 times.  Her point-of-care blood glucose was  HI, and A1c was  > 15%. - She reports that she has lost coverage for her CGM.  A1c during her last visit was 7.8%.    ) An ACE inhibitor/angiotensin II receptor blocker is being taken. Eye exam is current.  Hyperlipidemia This is a chronic problem. The current episode started more than 1 year ago. The problem is uncontrolled. Pertinent negatives include no chest pain, myalgias or shortness of breath. Current antihyperlipidemic treatment includes statins. Risk factors for coronary artery disease include diabetes mellitus, dyslipidemia, family history, hypertension and a sedentary lifestyle.  Hypertension This is a chronic problem. The current episode started more than 1 year ago. Pertinent negatives include no blurred vision, chest pain, headaches, palpitations or shortness of breath. Risk factors for coronary artery disease include diabetes mellitus, dyslipidemia, sedentary lifestyle and  smoking/tobacco exposure. Past treatments include ACE inhibitors.    Objective:       12/29/2023   11:00 AM 12/29/2023   10:18 AM 12/29/2023    9:42 AM  Vitals with BMI  Height  4' 11 4' 11  Weight  146 lbs 13 oz 146 lbs 13 oz  BMI  29.63 29.63  Systolic 122 144 872  Diastolic 79 88 84  Pulse 82 84 79    BP 127/84   Pulse 79   Resp 18   Ht 4' 11 (1.499 m)   Wt 146 lb 12.8 oz (66.6 kg)   SpO2 97%   BMI 29.65 kg/m   Wt  Readings from Last 3 Encounters:  12/29/23 146 lb 12.8 oz (66.6 kg)  12/29/23 146 lb 12.8 oz (66.6 kg)  11/06/23 158 lb (71.7 kg)     CMP ( most recent) CMP     Component Value Date/Time   NA 130 (L) 12/29/2023 1055   NA 137 09/14/2023 1108   K 3.9 12/29/2023 1055   CL 95 (L) 12/29/2023 1055   CO2 19 (L) 12/29/2023 1055   GLUCOSE 524 (HH) 12/29/2023 1055   GLUCOSE  12/29/2023 0955     Comment:     HIGH unknown value   BUN 12 12/29/2023 1055   BUN 17 09/14/2023 1108   CREATININE 0.82 12/29/2023 1055   CALCIUM 8.9 12/29/2023 1055   PROT 7.1 12/29/2023 1055   PROT 6.9 09/14/2023 1108   ALBUMIN 4.0 12/29/2023 1055   ALBUMIN 3.9 09/14/2023 1108   AST 14 (L) 12/29/2023 1055   ALT 16 12/29/2023 1055   ALKPHOS 128 (H) 12/29/2023 1055   BILITOT 0.3 12/29/2023 1055   BILITOT 0.3 09/14/2023 1108   GFRNONAA >60 12/29/2023 1055     Diabetic Labs (most recent): Lab Results  Component Value Date   HGBA1C 15.0 (A) 12/29/2023   HGBA1C >15 12/29/2023   HGBA1C 7.8 (A) 09/17/2023   MICROALBUR 10 05/14/2023   MICROALBUR <0.2 10/08/2020     Lipid Panel ( most recent) Lipid Panel     Component Value Date/Time   CHOL 320 (H) 12/08/2023 1613   TRIG 516 (H) 12/08/2023 1613   HDL 43 12/08/2023 1613   CHOLHDL 7.4 (H) 12/08/2023 1613   LDLCALC 173 (H) 12/08/2023 1613   LABVLDL 104 (H) 12/08/2023 1613      Lab Results  Component Value Date   TSH 0.080 (L) 12/08/2023   TSH 0.106 (L) 09/14/2023   TSH 0.20 (A) 07/01/2021   TSH 0.06 (A) 10/08/2020   FREET4 1.23 12/08/2023   FREET4 0.95 09/14/2023     Assessment & Plan:   1. Type 2 diabetes mellitus with hyperglycemia, without long-term current use of insulin  [facility]) Patient reported feeling dizzy.  In light of finding severe hyperglycemia  ( HI) and high A1c of > 15% suggestive of chronic hyperglycemia, patient was sent to ER for better evaluation and treatment with risk of impending DKA.  - Verena Shawgo [place] has currently  uncontrolled symptomatic type 2 DM since  53 years of age.  Brinlynn presents with inadequate monitoring, her meter showing average glucose of 418 mg per DL for the most recent 30 days reading only 16 times.  Her point-of-care blood glucose was  HI, and A1c was  > 15%. - She reports that she has lost coverage for her CGM.  A1c during her last visit was 7.8%.   Recent labs reviewed. - I had a long discussion  with her about the progressive nature of diabetes and the pathology behind its complications. 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 These are all discussed in detail with her.  - I have counseled her on diet  and weight management  by adopting a carbohydrate restricted/protein rich diet. Patient is encouraged to switch to  unprocessed or minimally processed     complex starch and increased protein intake (animal or plant source), fruits, and vegetables. -  she is advised to stick to a routine mealtimes to eat 3 meals  a day and avoid unnecessary snacks ( to snack only to correct hypoglycemia).  - she acknowledges that there is a room for improvement in her food and drink choices. - Suggestion is made for her to avoid simple carbohydrates  from her diet including Cakes, Sweet Desserts, Ice Cream, Soda (diet and regular), Sweet Tea, Candies, Chips, Cookies, Store Bought Juices, Alcohol in Excess of  1-2 drinks a day, Artificial Sweeteners,  Coffee Creamer, and Sugar-free Products, Lemonade. This will help patient to have more stable blood glucose profile and potentially avoid unintended weight gain.   - she will be scheduled with Penny Crumpton, RDN, CDE for diabetes education.  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    She presents with severe hyperglycemia, impending DKA/hyperosmolar state, patient was sent to ER.    - After ER evaluation and treatment, she will continue on Lantus 50 units nightly, NovoLog 10-16 units 3 times daily AC for Premeal blood glucose readings above 90 mg per DL.   Patient specific correction dose was given to her in writing.  She is not a suitable candidate for metformin  or GLP-1 receptor agonist.  She does not have appropriate coverage for medications, she wishes to avoid  more of expensive prescriptions.  She does not tolerate metformin . Apparently lost coverage for her CGM.  She will be assisting with prior authorization with freestyle libre or Dexcom .  -- she is encouraged to call clinic for blood glucose levels less than 70 or above 200 mg /dl weekly average.    - Specific targets for  A1c;  LDL, HDL,  and Triglycerides were discussed with the patient.  2) Blood Pressure /Hypertension:  Her blood pressure is controlled target.  she is advised to continue her current medications including lisinopril 5 mg p.o. daily with breakfast .   3) Lipids/Hyperlipidemia:   Review of her recent lipid panel showed worsening lipid panel with LDL at 173, total cholesterol at 320.  Questionable compliance with medications, advised to continue atorvastatin 80 mg p.o. nightly and Zetia  10 mg p.o. nightly.     Side effects and precautions discussed with her.  4)  Weight/Diet:  Body mass index is 29.65 kg/m.  -Due to her past history of EtOH with risk of exocrine pancreatic insufficiency, major weight loss is not recommended to her.  See above on macronutrients recommendations.   5) Chronic Care/Health Maintenance:  -she  is on ACEI/ARB and Statin medications and  is encouraged to initiate and continue to follow up with Ophthalmology, Dentist,  Podiatrist at least yearly or according to  recommendations, and advised to  quit smoking. I have  recommended yearly flu vaccine and pneumonia vaccine at least every 5 years; moderate intensity exercise for up to 150 minutes weekly; and  sleep for 7- 9 hours a day.    - she is  advised to maintain close follow up with Leavy Waddell NOVAK, FNP for primary care needs, as well as her other providers for optimal and coordinated care.  I spent  26  minutes in the care of the patient today including review of labs from CMP, Lipids, Thyroid  Function, Hematology (current and previous including abstractions from other facilities); face-to-face time discussing  her blood glucose readings/logs, discussing hypoglycemia and hyperglycemia episodes and symptoms, medications doses, her options of short and long term treatment based on the latest standards of care / guidelines;  discussion about incorporating lifestyle medicine;  and documenting the encounter. Risk reduction counseling performed per USPSTF guidelines to reduce cardiovascular risk factors.     Please refer to Patient Instructions for Blood Glucose Monitoring and Insulin /Medications Dosing Guide  in media tab for additional information. Please  also refer to  Patient Self Inventory in the Media  tab for reviewed elements of pertinent patient history.  Ronal Amble Culmer participated in the discussions, expressed understanding, and voiced agreement with the above plans.  All questions were answered to her satisfaction. she is encouraged to contact clinic should she have any questions or concerns prior to her return visit.   Follow up plan: - Return in about 2 weeks (around 01/12/2024) for F/U with Meter/CGM Jonnie Only - no Labs.  Ranny Earl, MD [facility] [address] [place], [place] [address] Phone: [phone]  Fax: [phone]    12/29/2023, 12:06 PM  This note was partially dictated with voice recognition software. Similar sounding  words can be transcribed inadequately or may not  be corrected upon review.   "

## 2023-12-29 NOTE — Discharge Instructions (Signed)
 Where to find more information American Diabetes Association (ADA): diabetes.org Contact a health care provider if: You have diabetes and have trouble keeping your blood sugar in the right range. Your blood sugar is at or above 240 mg/dL (86.6 mmol/L) for 2 days in a row. You have high blood sugar often. You have signs of illness, such as: Nausea or vomiting. A headache. A fever. You can't stop vomiting. Get help right away if: Your blood sugar monitor reads high even when you're taking insulin . You have trouble breathing. These symptoms may be an emergency. Call 911 right away. Do not wait to see if the symptoms will go away. Do not drive yourself to the hospital.

## 2023-12-29 NOTE — ED Provider Notes (Signed)
 " [facility] EMERGENCY DEPARTMENT AT [facility] Provider Note   CSN: [id number] Arrival date & time: 12/29/23  1011     Patient presents with: Hyperglycemia   Michele Myers is a 53 y.o. female sent in from her endorcinologist's office due to HGB A1C of 15 and cbg > 400.  She has a past medical history of insulin -dependent diabetes, subclinical hypothyroidism.  She reports that she has had increased urinary frequency and some lightheadedness and dizziness has been very thirsty.  She has been taking her medications as prescribed.  She reports that she used to have a Dexcom and her insurance stopped covering it.  She has had very poor blood sugar control for some time.  She reports that last week she had the flu and her sugars were almost 900 at the home every day she states my doctor really listen to me and last week!  She denies any chest pain, shortness of breath, dysuria, hematuria, flank pain or fevers    Hyperglycemia      Prior to Admission medications  Medication Sig Start Date End Date Taking? Authorizing Provider  alendronate (FOSAMAX) 70 MG tablet Take 70 mg by mouth once a week. 03/06/23   [provider]  aspirin 81 MG chewable tablet 1 tablet Orally Once a day    [provider]  atorvastatin (LIPITOR) 80 MG tablet Take 80 mg by mouth daily. 03/06/23   [provider]  Blood Glucose Monitoring Suppl (ACCU-CHEK GUIDE ME) w/Device KIT 1 Piece by Does not apply route as directed. 11/28/20   Nida, Gebreselassie W, MD  Continuous Glucose Receiver (DEXCOM G7 RECEIVER) DEVI USE FOR CONTINUOUSE BLOOD GLUCOSE MONITORING. 11/20/23   Nida, Gebreselassie W, MD  Continuous Glucose Sensor (DEXCOM G7 SENSOR) MISC 1 (ONE) EACH Q10 DAYS 10/05/23   Nida, Gebreselassie W, MD  ezetimibe  (ZETIA ) 10 MG tablet Take 1 tablet (10 mg total) by mouth daily. 09/17/23   Nida, Gebreselassie W, MD  FLUoxetine (PROZAC) 40 MG capsule Take 40 mg by mouth daily.    [provider]  glucose blood (ACCU-CHEK GUIDE) test strip USE TO CHECK BOOD GLUCOSE  4 TIMES DAILY 09/24/21   Nida, Gebreselassie W, MD  ibuprofen (ADVIL) 800 MG tablet 800 mg every 8 (eight) hours as needed. 06/22/22   [provider]  insulin  glargine (LANTUS) 100 UNIT/ML Solostar Pen Inject 50 Units into the skin at bedtime.    [provider]  Insulin  Pen Needle (EMBECTA PEN NEEDLE NANO 2 GEN) 32G X 4 MM MISC Use to inject insulin  four times daily as directed 08/17/23   Nida, Gebreselassie W, MD  lisinopril (ZESTRIL) 20 MG tablet Take 20 mg by mouth daily. 01/12/22   [provider]  meloxicam (MOBIC) 7.5 MG tablet Take 7.5 mg by mouth daily. 02/24/23   [provider]  methocarbamol (ROBAXIN) 500 MG tablet Take 500 mg by mouth 2 (two) times daily. 08/15/21   [provider]  nitroGLYCERIN (NITROSTAT) 0.4 MG SL tablet Place 0.4 mg under the tongue every 5 (five) minutes as needed for chest pain.    [provider]  NOVOLOG FLEXPEN 100 UNIT/ML FlexPen Inject 10-16 Units into the skin 3 (three) times daily before meals. 02/24/23   [provider]  omeprazole (PRILOSEC) 40 MG capsule Take 40 mg by mouth every morning. 11/30/21   [provider]    Allergies: Codeine    Review of Systems  Updated Vital Signs BP (!) 144/88  Pulse 84   Temp 98 F (36.7 C) (Oral)   Resp 18   Ht 4' 11 (1.499 m)   Wt 66.6 kg   SpO2 98%   BMI 29.65 kg/m   Physical Exam Vitals and nursing note reviewed.  Constitutional:      General: She is not in acute distress.    Appearance: She is well-developed. She is not diaphoretic.  HENT:     Head: Normocephalic and atraumatic.     Right Ear: External ear normal.     Left Ear: External ear normal.     Nose: Nose normal.     Mouth/Throat:     Mouth: Mucous membranes are moist.  Eyes:     General: No scleral icterus.    Conjunctiva/sclera: Conjunctivae normal.  Cardiovascular:     Rate and  Rhythm: Normal rate and regular rhythm.     Heart sounds: Normal heart sounds. No murmur heard.    No friction rub. No gallop.  Pulmonary:     Effort: Pulmonary effort is normal. No respiratory distress.     Breath sounds: Normal breath sounds.  Abdominal:     General: Bowel sounds are normal. There is no distension.     Palpations: Abdomen is soft. There is no mass.     Tenderness: There is no abdominal tenderness. There is no guarding.  Musculoskeletal:     Cervical back: Normal range of motion.  Skin:    General: Skin is warm and dry.  Neurological:     Mental Status: She is alert and oriented to person, place, and time.  Psychiatric:        Behavior: Behavior normal.     (all labs ordered are listed, but only abnormal results are displayed) Labs Reviewed  URINALYSIS, ROUTINE W REFLEX MICROSCOPIC - Abnormal; Notable for the following components:      Result Value   Color, Urine COLORLESS (*)    Glucose, UA >=500 (*)    All other components within normal limits  CBG MONITORING, ED - Abnormal; Notable for the following components:   Glucose-Capillary 501 (*)    All other components within normal limits  CBC  COMPREHENSIVE METABOLIC PANEL WITH GFR  BETA-HYDROXYBUTYRIC ACID  BLOOD GAS, VENOUS  CBG MONITORING, ED    EKG: None  Radiology: No results found.   Procedures   Medications Ordered in the ED - No data to display  Clinical Course as of 12/29/23 1759  Tue Dec 29, 2023  1142 Glucose(!!): 524 [AH]  1142 CO2(!): 19 [AH]  1142 Anion gap(!): 16 [AH]    Clinical Course User Index [AH] Arloa Chroman, PA-C                                 Medical Decision Making Amount and/or Complexity of Data Reviewed Labs: ordered. Decision-making details documented in ED Course.  Risk Prescription drug management.   This patient presents to the ED for concern of hyperglycemia this involves an extensive number of treatment options, and is a complaint that carries  with it a high risk of complications and morbidity.  DDX includes, infection, medication noncompliance, worsening insulin  resistance, DKA, HHS.  Co morbidities:  Past Medical History: No date: Angina at rest No date: Backache No date: Bipolar 1 disorder, depressed (HCC) No date: Diabetes mellitus without complication (HCC) No date: Diabetes mellitus, type II (HCC) No date: Dizzy spells No date: Glucosuria No date: History  of seizures No date: Hyperlipidemia No date: Hypertension No date: Menopausal hot flushes No date: PTSD (post-traumatic stress disorder) No date: Tachycardia   Social Determinants of Health:   SDOH Screenings   Food Insecurity: No Food Insecurity (01/27/2023)   Received from [facility]  Transportation Needs: No Transportation Needs (01/27/2023)   Received from [facility]  Utilities: Low Risk (01/27/2023)   Received from [facility]  Financial Resource Strain: Low Risk (01/27/2023)   Received from [facility]  Physical Activity: Sufficiently Active (01/27/2023)   Received from [facility]  Social Connections: Moderately Isolated (01/27/2023)   Received from [facility]  Stress: Stress Concern Present (01/27/2023)   Received from [facility]  Tobacco Use: High Risk (12/29/2023)  Health Literacy: Low Risk (01/27/2023)   Received from [facility]     Additional history:  Additional history obtained from the patient External records from outside source obtained and reviewed including paperwork sent in from her endocrinologist which read that she has an hemoglobin A1c of 15  Lab Tests:  I Ordered, and personally interpreted labs.  The pertinent results include:   Initial blood sugar here 524. Very borderline potential for DKA with a gap of 16 which could also be due to the fact that her chloride is 95.  Her CO2 is 19. After treatment patient's blood glucose 190 9 repeat BMP shows that her gap closed and her chloride and CO2  improved significantly. Imaging Studies:    Cardiac Monitoring/ECG:  The patient was maintained on a cardiac monitor.  I personally viewed and interpreted the cardiac monitored which showed an underlying rhythm of:   Medicines ordered and prescription drug management:  I ordered medication including  Medications  insulin  regular, human (MYXREDLIN ) 100 units/ 100 mL infusion (0 Units/hr Intravenous Stopped 12/29/23 1304)  lactated ringers  infusion (0 mLs Intravenous Stopped 12/29/23 1304)  dextrose  5 % in lactated ringers  infusion (has no administration in time range)  dextrose  50 % solution 0-50 mL (has no administration in time range)  sodium chloride  0.9 % bolus 1,000 mL (0 mLs Intravenous Stopped 12/29/23 1323)  sodium chloride  0.9 % bolus 1,000 mL (0 mLs Intravenous Stopped 12/29/23 1323)   for hyperglycemia Reevaluation of the patient after these medicines showed that the patient resolved I have reviewed the patients home medicines and have made adjustments as needed  Test Considered:    Critical Interventions:   IV fluids, IV insulin   Consultations Obtained:   Problem List / ED Course:     ICD-10-CM   1. Hyperglycemia  R73.9       MDM: Here with hyperglycemia.  She reports that she had flulike symptoms with blood sugars above 900 last week probably is one of the causes of her increased blood sugar.  She is well-appearing without coo small breathing and without signs of acidosis.  Her VBG is within normal limits.  Think this represents poorly controlled outpatient diabetes with hyperglycemia.  I discussed bringing the patient in versus letting her go home given the fact that all of her labs have normalized and she does not appear to have HHS or DKA.  She does not wish to stay in the hospital.  I think that that is totally appropriate and she does not need to stay and she can be discharged with outpatient follow-up with her endocrinologist for chronic blood sugar  management.  She appears appropriate for discharge at this time   Dispostion:  After  consideration of the diagnostic results and the patients response to treatment, I feel that the patent would benefit from discharge with strict return precautions.      Final diagnoses:  None    ED Discharge Orders     None          Arloa Chroman, PA-C 12/29/23 1804    Freddi Hamilton, MD 01/05/24 1152  "

## 2024-01-13 ENCOUNTER — Encounter: Payer: Self-pay | Admitting: "Endocrinology

## 2024-01-13 ENCOUNTER — Ambulatory Visit: Admitting: "Endocrinology

## 2024-01-13 VITALS — BP 120/74 | HR 68 | Ht 59.0 in | Wt 147.4 lb

## 2024-01-13 DIAGNOSIS — E782 Mixed hyperlipidemia: Secondary | ICD-10-CM

## 2024-01-13 DIAGNOSIS — E1165 Type 2 diabetes mellitus with hyperglycemia: Secondary | ICD-10-CM

## 2024-01-13 DIAGNOSIS — Z794 Long term (current) use of insulin: Secondary | ICD-10-CM | POA: Diagnosis not present

## 2024-01-13 DIAGNOSIS — I10 Essential (primary) hypertension: Secondary | ICD-10-CM | POA: Diagnosis not present

## 2024-01-13 MED ORDER — DEXCOM G7 SENSOR MISC: 3 refills | Status: AC

## 2024-01-13 NOTE — Progress Notes (Signed)
 "                                                                              01/13/2024, 9:51 AM  Endocrinology follow-up note   Subjective:    Patient ID: Michele Myers, female    DOB: 1970/08/04.  Michele Myers is being seen in follow-up after she was seen in consultation for management of currently uncontrolled symptomatic diabetes requested by  Leavy Waddell NOVAK, FNP.   Past Medical History:  Diagnosis Date   Angina at rest    Backache    Bipolar 1 disorder, depressed (HCC)    Diabetes mellitus without complication (HCC)    Diabetes mellitus, type II (HCC)    Dizzy spells    Glucosuria    History of seizures    Hyperlipidemia    Hypertension    Menopausal hot flushes    PTSD (post-traumatic stress disorder)    Tachycardia     Past Surgical History:  Procedure Laterality Date   ABDOMINAL HYSTERECTOMY     APPENDECTOMY     CARPAL TUNNEL RELEASE     CESAREAN SECTION     3   CHOLECYSTECTOMY  01/2016   ROTATOR CUFF REPAIR      Social History   Socioeconomic History   Marital status: Single    Spouse name: Not on file   Number of children: Not on file   Years of education: Not on file   Highest education level: Not on file  Occupational History   Not on file  Tobacco Use   Smoking status: Former    Current packs/day: 0.00    Types: Cigarettes    Quit date: 09/10/2020    Years since quitting: 3.3   Smokeless tobacco: Current   Tobacco comments:    vapes  Vaping Use   Vaping status: Every Day  Substance and Sexual Activity   Alcohol use: Not Currently    Comment: history of etoh abuse for 9 years, clean since 1998   Drug use: Never   Sexual activity: Not Currently  Other Topics Concern   Not on file  Social History Narrative   Not on file   Social Drivers of Health   Tobacco Use: High Risk (01/13/2024)   Patient History    Smoking Tobacco Use: Former    Smokeless Tobacco Use: Current    Passive Exposure: Not on Actuary Strain:  Low Risk (01/27/2023)   Received from [facility]   Overall Financial Resource Strain (CARDIA)    Difficulty of Paying Living Expenses: Not hard at all  Food Insecurity: No Food Insecurity (01/27/2023)   Received from [facility]   Epic    Within the past 12 months, you worried that your food would run out before you got the money to buy more.: Never true    Within the past 12 months, the food you bought just didn't last and you didn't have money to get more.: Never true  Transportation Needs: No Transportation Needs (01/27/2023)   Received from [facility]   PRAPARE - Transportation    Lack of Transportation (Medical): No    Lack of Transportation (Non-Medical): No  Physical Activity: Sufficiently Active (01/27/2023)   Received from [facility]   Exercise Vital Sign    On average, how many days per week do you engage in moderate to strenuous exercise (like a brisk walk)?: 7 days    On average, how many minutes do you engage in exercise at this level?: 30 min  Stress: Stress Concern Present (01/27/2023)   Received from [facility] of Occupational [facility] - Occupational Stress Questionnaire    Feeling of Stress : To some extent  Social Connections: Moderately Isolated (01/27/2023)   Received from [facility]   Social Connection and Isolation Panel    In a typical week, how many times do you talk on the phone with family, friends, or neighbors?: More than three times a week    How often do you get together with friends or relatives?: Once a week    How often do you attend church or religious services?: Never    Do you belong to any clubs or organizations such as church groups, unions, fraternal or athletic groups, or school groups?: No    How often do you attend meetings of the clubs or organizations you belong to?: Never    Are you married, widowed, divorced, separated, never married, or living with a partner?: Living with partner  Depression  (PHQ2-9): Not on file  Alcohol Screen: Not on file  Housing: Not on file  Utilities: Low Risk (01/27/2023)   Received from [facility]   Utilities    Within the past 12 months, have you been unable to get utilities(heat, electricity) when it was really needed?: No  Health Literacy: Low Risk (01/27/2023)   Received from [facility] Literacy    How often do you need to have someone help you when you read instructions, pamphlets, or other written material from your doctor or pharmacy?: Never    Family History  Problem Relation Age of Onset   Cancer Mother        thyroid    Kidney disease Mother    Thyroid  disease Mother    Diabetes Mother    Hyperlipidemia Mother    Osteoporosis Mother    Cancer Father        prostate, small cell lung cancer   Hyperlipidemia Father    Hypertension Father    Heart attack Father    Breast cancer Sister    Ovarian cancer Sister    Throat cancer Sister    Heart failure Brother    Diabetes Brother    Kidney disease Brother    Pancreatic cancer Maternal Grandmother    Throat cancer Maternal Grandmother    Breast cancer Maternal Aunt    Cancer Maternal Uncle    Colon cancer Paternal Uncle        possible   Inflammatory bowel disease Neg Hx    Crohn's disease Neg Hx    Celiac disease Neg Hx     Outpatient Encounter Medications as of 01/13/2024  Medication Sig   alendronate (FOSAMAX) 70 MG tablet Take 70 mg by mouth once a week.   aspirin 81 MG chewable tablet 1 tablet Orally Once a day   atorvastatin (LIPITOR) 80 MG tablet Take 80 mg by mouth daily.   Blood Glucose Monitoring Suppl (ACCU-CHEK GUIDE ME) w/Device KIT 1 Piece by Does not apply route as directed.   Continuous Glucose Receiver (DEXCOM G7 RECEIVER) DEVI USE FOR CONTINUOUSE BLOOD GLUCOSE MONITORING.  Continuous Glucose Sensor (DEXCOM G7 SENSOR) MISC Use to monitor glucose continuously   ezetimibe  (ZETIA ) 10 MG tablet Take 1 tablet (10 mg total) by mouth daily.    FLUoxetine (PROZAC) 40 MG capsule Take 40 mg by mouth daily.   glucose blood (ACCU-CHEK GUIDE) test strip USE TO CHECK BOOD GLUCOSE  4 TIMES DAILY   ibuprofen (ADVIL) 800 MG tablet 800 mg every 8 (eight) hours as needed.   insulin  glargine (LANTUS) 100 UNIT/ML Solostar Pen Inject 60 Units into the skin at bedtime.   Insulin  Pen Needle (EMBECTA PEN NEEDLE NANO 2 GEN) 32G X 4 MM MISC Use to inject insulin  four times daily as directed   lisinopril (ZESTRIL) 20 MG tablet Take 20 mg by mouth daily.   meloxicam (MOBIC) 7.5 MG tablet Take 7.5 mg by mouth daily.   methocarbamol (ROBAXIN) 500 MG tablet Take 500 mg by mouth 2 (two) times daily.   nitroGLYCERIN (NITROSTAT) 0.4 MG SL tablet Place 0.4 mg under the tongue every 5 (five) minutes as needed for chest pain.   NOVOLOG FLEXPEN 100 UNIT/ML FlexPen Inject 10-16 Units into the skin 3 (three) times daily before meals.   omeprazole (PRILOSEC) 40 MG capsule Take 40 mg by mouth every morning.   [DISCONTINUED] Continuous Glucose Sensor (DEXCOM G7 SENSOR) MISC 1 (ONE) EACH Q10 DAYS   No facility-administered encounter medications on file as of 01/13/2024.    ALLERGIES: Allergies  Allergen Reactions   Melatonin Nausea And Vomiting    dizziness   Codeine Anxiety    Rapid heart beat    VACCINATION STATUS:  There is no immunization history on file for this patient.  Diabetes She presents for her follow-up diabetic visit. She has type 2 diabetes mellitus. Onset time: she was diagnosed at approximate age of 38 yrs. Her disease course has been worsening. There are no hypoglycemic associated symptoms. Pertinent negatives for hypoglycemia include no headaches, seizures or tremors. Pertinent negatives for diabetes include no blurred vision, no chest pain, no polydipsia and no polyuria. There are no hypoglycemic complications. Symptoms are worsening. There are no diabetic complications. Risk factors for coronary artery disease include dyslipidemia, diabetes  mellitus, family history, sedentary lifestyle and tobacco exposure. Current diabetic treatment includes oral agent (monotherapy). Her weight is fluctuating minimally. She is following a generally unhealthy diet. When asked about meal planning, she reported none. She has not had a previous visit with a dietitian. She never participates in exercise. Her home blood glucose trend is increasing steadily. Her overall blood glucose range is >200 mg/dl. Michele Myers presents with inadequate monitoring, her meter showing average glucose of  336 for the most recent 7 days.  She ran out of Dexcom sensors, last use on October 02, 2023.   Her point-of-care A1c was greater than 15% today.  She was recently sent to the ER for hyperglycemia with impending DKA.   ) An ACE inhibitor/angiotensin II receptor blocker is being taken. Eye exam is current.  Hyperlipidemia This is a chronic problem. The current episode started more than 1 year ago. The problem is uncontrolled. Pertinent negatives include no chest pain, myalgias or shortness of breath. Current antihyperlipidemic treatment includes statins. Risk factors for coronary artery disease include diabetes mellitus, dyslipidemia, family history, hypertension and a sedentary lifestyle.  Hypertension This is a chronic problem. The current episode started more than 1 year ago. Pertinent negatives include no blurred vision, chest pain, headaches, palpitations or shortness of breath. Risk factors for coronary artery disease include diabetes mellitus,  dyslipidemia, sedentary lifestyle and smoking/tobacco exposure. Past treatments include ACE inhibitors.    Objective:       01/13/2024    9:00 AM 12/29/2023    1:00 PM 12/29/2023   11:00 AM  Vitals with BMI  Height 4' 11    Weight 147 lbs 6 oz    BMI 29.76    Systolic 120 128 877  Diastolic 74 75 79  Pulse 68 67 82    BP 120/74   Pulse 68   Ht 4' 11 (1.499 m)   Wt 147 lb 6.4 oz (66.9 kg)   BMI 29.77 kg/m   Wt  Readings from Last 3 Encounters:  01/13/24 147 lb 6.4 oz (66.9 kg)  12/29/23 146 lb 12.8 oz (66.6 kg)  12/29/23 146 lb 12.8 oz (66.6 kg)     CMP ( most recent) CMP     Component Value Date/Time   NA 134 (L) 12/29/2023 1159   NA 137 09/14/2023 1108   K 3.8 12/29/2023 1159   CL 102 12/29/2023 1159   CO2 20 (L) 12/29/2023 1159   GLUCOSE 370 (H) 12/29/2023 1159   GLUCOSE  12/29/2023 0955     Comment:     HIGH unknown value   BUN 11 12/29/2023 1159   BUN 17 09/14/2023 1108   CREATININE 0.71 12/29/2023 1159   CALCIUM 8.3 (L) 12/29/2023 1159   PROT 7.1 12/29/2023 1055   PROT 6.9 09/14/2023 1108   ALBUMIN 4.0 12/29/2023 1055   ALBUMIN 3.9 09/14/2023 1108   AST 14 (L) 12/29/2023 1055   ALT 16 12/29/2023 1055   ALKPHOS 128 (H) 12/29/2023 1055   BILITOT 0.3 12/29/2023 1055   BILITOT 0.3 09/14/2023 1108   GFRNONAA >60 12/29/2023 1159     Diabetic Labs (most recent): Lab Results  Component Value Date   HGBA1C 15.0 (A) 12/29/2023   HGBA1C >15 12/29/2023   HGBA1C 7.8 (A) 09/17/2023   MICROALBUR 10 05/14/2023   MICROALBUR <0.2 10/08/2020     Lipid Panel ( most recent) Lipid Panel     Component Value Date/Time   CHOL 320 (H) 12/08/2023 1613   TRIG 516 (H) 12/08/2023 1613   HDL 43 12/08/2023 1613   CHOLHDL 7.4 (H) 12/08/2023 1613   LDLCALC 173 (H) 12/08/2023 1613   LABVLDL 104 (H) 12/08/2023 1613      Lab Results  Component Value Date   TSH 0.080 (L) 12/08/2023   TSH 0.106 (L) 09/14/2023   TSH 0.20 (A) 07/01/2021   TSH 0.06 (A) 10/08/2020   FREET4 1.23 12/08/2023   FREET4 0.95 09/14/2023     Assessment & Plan:   1. Type 2 diabetes mellitus with hyperglycemia, with long-term current use of insulin  (HCC)  - Michele Myers has currently uncontrolled symptomatic type 2 DM since  54 years of age. She  presents with inadequate monitoring, her meter showing average glucose of  336 for the most recent 7 days.  She ran out of Dexcom sensors, last use on October 02, 2023.   Her point-of-care A1c was greater than 15% today.  She was recently sent to the ER for hyperglycemia with impending DKA.   Recent labs reviewed. - I had a long discussion with her about the progressive nature of diabetes and the pathology behind its complications. 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  These are all discussed in detail with her.  - I have counseled her on diet  and weight management  by adopting a carbohydrate restricted/protein rich diet. Patient is encouraged to switch to  unprocessed or minimally processed     complex starch and increased protein intake (animal or plant source), fruits, and vegetables. -  she is advised to stick to a routine mealtimes to eat 3 meals  a day and avoid unnecessary snacks ( to snack only to correct hypoglycemia).  - she acknowledges that there is a room for improvement in her food and drink choices. - Suggestion is made for her to avoid simple carbohydrates  from her diet including Cakes, Sweet Desserts, Ice Cream, Soda (diet and regular), Sweet Tea, Candies, Chips, Cookies, Store Bought Juices, Alcohol in Excess of  1-2 drinks a day, Artificial Sweeteners,  Coffee Creamer, and Sugar-free Products, Lemonade. This will help patient to have more stable blood glucose profile and potentially avoid unintended weight gain.   - she will be scheduled with Penny Crumpton, RDN, CDE for diabetes education.  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    She presents with persistent severe hyperglycemia, with high risk for DKA/hyperosmolar state.   - Her average blood glucose is 336  for the last 7 days.  She is appropriately up to reengage for intensive treatment with basal/bolus insulin . - She is advised to increase Lantus to 60 units nightly, resume and continue NovoLog 10-16 units 3 times daily AC for Premeal blood glucose readings above 90 mg per DL.   Patient specific correction dose was given to her in writing.  She is given glucometer with adequate testing supplies as well as samples of discount sensors from clinic.  -She is encouraged to call clinic for hypoglycemia under 70 or hyperglycemia above 300 mg/day.  She is not a suitable candidate for metformin  or GLP-1 receptor agonist.  She does not have appropriate coverage for medications, she wishes to avoid  more of expensive prescriptions.  She does not tolerate metformin .  -- she is encouraged to call clinic for blood glucose levels less than 70 or above 200 mg /dl weekly average.    - Specific targets for  A1c;  LDL, HDL,  and Triglycerides were discussed with the patient.  2) Blood Pressure /Hypertension:  Her blood pressure is controlled to target.  she is advised to continue her current medications including lisinopril 5 mg p.o. daily with breakfast .   3) Lipids/Hyperlipidemia:   Review of her recent lipid panel showed worsening lipid panel with LDL at 173, total cholesterol at 320.  Questionable compliance with medications, advised to continue atorvastatin 80 mg p.o. nightly and Zetia  10 mg p.o. nightly.     Side effects and precautions discussed with her.  4)  Weight/Diet:  Body mass index is 29.77 kg/m.  -Due to her past history of EtOH with risk of exocrine pancreatic insufficiency, major weight loss is not recommended to her.  See above on macronutrients recommendations.   5) Chronic Care/Health Maintenance:  -she  is on ACEI/ARB and Statin medications and  is encouraged to initiate and continue to follow up with Ophthalmology, Dentist,  Podiatrist at least yearly or according to recommendations,  and advised to  quit smoking. I have recommended yearly flu vaccine and pneumonia vaccine at least  every 5 years; moderate intensity exercise for up to 150 minutes weekly; and  sleep for 7- 9 hours a day.    - she is  advised to maintain close follow up with Leavy Waddell NOVAK, FNP for primary care needs, as well as her other providers for optimal and coordinated care.   I spent  26  minutes in the care of the patient today including review of labs from CMP, Lipids, Thyroid  Function, Hematology (current and previous including abstractions from other facilities); face-to-face time discussing  her blood glucose readings/logs, discussing hypoglycemia and hyperglycemia episodes and symptoms, medications doses, her options of short and long term treatment based on the latest standards of care / guidelines;  discussion about incorporating lifestyle medicine;  and documenting the encounter. Risk reduction counseling performed per USPSTF guidelines to reduce  cardiovascular risk factors.     Please refer to Patient Instructions for Blood Glucose Monitoring and Insulin /Medications Dosing Guide  in media tab for additional information. Please  also refer to  Patient Self Inventory in the Media  tab for reviewed elements of pertinent patient history.  Michele Myers participated in the discussions, expressed understanding, and voiced agreement with the above plans.  All questions were answered to her satisfaction. she is encouraged to contact clinic should she have any questions or concerns prior to her return visit. Dear Patient: Feel free to review your progress notes.  If you are reviewing this progress note and have questions about the meaning of /or medical terms being used, please make a note and address it at your next follow-up appointment.  Medical notes are meant to be a communication tool between medical professionals and require medical terms to be used for efficiency and insurance  approval.    Follow up plan: - Return in about 3 months (around 04/12/2024) for Bring Meter/CGM Device/Logs- A1c in Office.  Michele Earl, MD [facility] [address] [place], [place] [address] Phone: [phone]  Fax: [phone]    01/13/2024, 9:51 AM  This note was partially dictated with voice recognition software. Similar sounding words can be transcribed inadequately or may not  be corrected upon review.   "

## 2024-01-13 NOTE — Patient Instructions (Signed)

## 2024-02-01 ENCOUNTER — Telehealth: Payer: Self-pay | Admitting: Pharmacy Technician

## 2024-02-01 ENCOUNTER — Other Ambulatory Visit (HOSPITAL_COMMUNITY): Payer: Self-pay

## 2024-02-01 NOTE — Telephone Encounter (Signed)
 Pharmacy Patient Advocate Encounter  Received notification from [employer] that Prior Authorization for Dexcom G7 Sensor  has been APPROVED from 02/01/24 to 07/30/24. Ran test claim, Copay is $0.00. This test claim was processed through [facility]- copay amounts may vary at other pharmacies due to pharmacy/plan contracts, or as the patient moves through the different stages of their insurance plan.   PA #/Case ID/Reference #: [id number]

## 2024-02-01 NOTE — Telephone Encounter (Signed)
 Pharmacy Patient Advocate Encounter   Received notification from [facility] KEY that prior authorization for Dexcom G7 Sensor  is required/requested.   Insurance verification completed.   The patient is insured through [employer].   Per test claim: PA required; PA submitted to above mentioned insurance via Latent Key/confirmation #/[id number] Status is pending

## 2024-04-12 ENCOUNTER — Ambulatory Visit: Admitting: "Endocrinology
